# Patient Record
Sex: Female | Born: 1975 | Race: White | Hispanic: No | Marital: Married | Smoking: Former smoker
Health system: Southern US, Community
[De-identification: ages and names within clinical notes are randomized; demographics above are authoritative.]

## PROBLEM LIST (undated history)

## (undated) DIAGNOSIS — R51 Headache: Secondary | ICD-10-CM

## (undated) DIAGNOSIS — R519 Headache, unspecified: Secondary | ICD-10-CM

## (undated) DIAGNOSIS — D649 Anemia, unspecified: Secondary | ICD-10-CM

## (undated) DIAGNOSIS — C801 Malignant (primary) neoplasm, unspecified: Secondary | ICD-10-CM

## (undated) DIAGNOSIS — Z87442 Personal history of urinary calculi: Secondary | ICD-10-CM

## (undated) DIAGNOSIS — K219 Gastro-esophageal reflux disease without esophagitis: Secondary | ICD-10-CM

## (undated) DIAGNOSIS — I499 Cardiac arrhythmia, unspecified: Secondary | ICD-10-CM

## (undated) HISTORY — PX: TUBAL LIGATION: SHX77

## (undated) HISTORY — PX: ABDOMINAL HYSTERECTOMY: SHX81

## (undated) HISTORY — PX: OVARIAN CYST SURGERY: SHX726

---

## 2006-06-20 ENCOUNTER — Ambulatory Visit: Payer: Self-pay | Admitting: Ophthalmology

## 2009-07-18 ENCOUNTER — Emergency Department: Payer: Self-pay | Admitting: Emergency Medicine

## 2011-12-28 ENCOUNTER — Ambulatory Visit: Payer: Self-pay | Admitting: Obstetrics and Gynecology

## 2011-12-28 LAB — PREGNANCY, URINE: Pregnancy Test, Urine: NEGATIVE m[IU]/mL

## 2011-12-28 LAB — HEMOGLOBIN: HGB: 13.3 g/dL (ref 12.0–16.0)

## 2012-01-05 ENCOUNTER — Ambulatory Visit: Payer: Self-pay | Admitting: Obstetrics and Gynecology

## 2012-01-05 LAB — PREGNANCY, URINE: Pregnancy Test, Urine: NEGATIVE m[IU]/mL

## 2013-09-11 ENCOUNTER — Ambulatory Visit: Payer: Self-pay | Admitting: Internal Medicine

## 2013-10-20 ENCOUNTER — Emergency Department: Payer: Self-pay | Admitting: Emergency Medicine

## 2013-10-20 LAB — COMPREHENSIVE METABOLIC PANEL
Albumin: 3.8 g/dL (ref 3.4–5.0)
Anion Gap: 5 — ABNORMAL LOW (ref 7–16)
BUN: 9 mg/dL (ref 7–18)
Chloride: 109 mmol/L — ABNORMAL HIGH (ref 98–107)
Co2: 25 mmol/L (ref 21–32)
SGOT(AST): 19 U/L (ref 15–37)
SGPT (ALT): 17 U/L (ref 12–78)
Total Protein: 7.2 g/dL (ref 6.4–8.2)

## 2013-10-20 LAB — URINALYSIS, COMPLETE
Bacteria: NONE SEEN
Bilirubin,UR: NEGATIVE
Glucose,UR: NEGATIVE mg/dL (ref 0–75)
Nitrite: NEGATIVE
Ph: 5 (ref 4.5–8.0)
RBC,UR: 47 /HPF (ref 0–5)
Specific Gravity: 1.029 (ref 1.003–1.030)

## 2013-10-20 LAB — CBC
HGB: 13.2 g/dL (ref 12.0–16.0)
MCH: 29.4 pg (ref 26.0–34.0)
MCHC: 33.7 g/dL (ref 32.0–36.0)
MCV: 87 fL (ref 80–100)
RDW: 13.3 % (ref 11.5–14.5)

## 2013-10-20 LAB — LIPASE, BLOOD: Lipase: 115 U/L (ref 73–393)

## 2013-10-21 ENCOUNTER — Emergency Department: Payer: Self-pay | Admitting: Emergency Medicine

## 2013-10-21 LAB — BASIC METABOLIC PANEL
BUN: 15 mg/dL (ref 7–18)
Calcium, Total: 9.3 mg/dL (ref 8.5–10.1)
Chloride: 108 mmol/L — ABNORMAL HIGH (ref 98–107)
Co2: 26 mmol/L (ref 21–32)
Creatinine: 1.31 mg/dL — ABNORMAL HIGH (ref 0.60–1.30)
EGFR (African American): >60
EGFR (Non-African Amer.): 53 — ABNORMAL LOW
Glucose: 108 mg/dL — ABNORMAL HIGH (ref 65–99)
Osmolality: 277 (ref 275–301)
Potassium: 3.6 mmol/L (ref 3.5–5.1)
Sodium: 138 mmol/L (ref 136–145)

## 2013-10-21 LAB — CBC WITH DIFFERENTIAL/PLATELET
Basophil %: 0.5 %
Eosinophil #: 0 10*3/uL (ref 0.0–0.7)
Eosinophil %: 0.5 %
HCT: 34 % — ABNORMAL LOW (ref 35.0–47.0)
HGB: 11.6 g/dL — ABNORMAL LOW (ref 12.0–16.0)
Lymphocyte #: 1 10*3/uL (ref 1.0–3.6)
MCH: 29.8 pg (ref 26.0–34.0)
MCV: 88 fL (ref 80–100)
Monocyte #: 0.5 x10 3/mm (ref 0.2–0.9)
Monocyte %: 6.5 %
Neutrophil #: 6.7 10*3/uL — ABNORMAL HIGH (ref 1.4–6.5)
RBC: 3.88 10*6/uL (ref 3.80–5.20)
RDW: 13.5 % (ref 11.5–14.5)

## 2014-04-27 ENCOUNTER — Other Ambulatory Visit: Payer: Self-pay | Admitting: Physician Assistant

## 2014-04-27 LAB — D-DIMER(ARMC): D-DIMER: 209 ng/mL

## 2015-02-28 NOTE — Op Note (Signed)
PATIENT NAME:  Sara, Tucker MR#:  [id number] DATE OF BIRTH:  Jan 12, 1976  DATE OF PROCEDURE:  01/05/2012  PREOPERATIVE DIAGNOSES:  1. Elective permanent sterilization.  2. Elective removal of intrauterine device.   POSTOPERATIVE DIAGNOSES:  1. Elective permanent sterilization.  2. Elective removal of intrauterine device.   PROCEDURES PERFORMED:  1. Removal of IUD. 2. Laparoscopic bilateral tubal ligation, Falope rings.   SURGEON: Laverta Baltimore, MD  ANESTHESIA: General endotracheal.   INDICATION: This is a 39 year old gravida 2, para 2. The patient desires elective permanent sterilization. The patient has been using an intrauterine device contraception.   DESCRIPTION OF PROCEDURE: After adequate general endotracheal anesthesia, the patient was prepped and draped in normal sterile fashion. Speculum exam revealed IUD strings, which was removed with ring forceps; IUD intact. A single-tooth tenaculum was applied on the anterior cervix and a Kahn cannula placed in the endocervical canal to be used for uterine manipulation during the procedure. Gloves were changed. A 12 mm infraumbilical incision was made after injecting with 5% Marcaine. The laparoscope was advanced into the abdominal cavity under direct visualization with the Optiview cannula. The patient's abdomen was insufflated. A second port site was placed two fingerbreadths above the symphysis pubis. Again after injecting with Marcaine, the trocar was advanced into the abdominal cavity under direct visualization. Of note, the bladder was previously drained prior to commencement of the case yielding 50 mL of urine. The patient was placed in Trendelenburg. The ovaries and the fallopian tubes appeared normal. The right fallopian tube was grasped at the isthmic ampullary portion of the fallopian tube and the Falope ring was applied with resulting 1.5 cm knuckle of fallopian tube noted. A similar procedure was repeated on the patient's  left fallopian tube after visualizing the fimbriated end. Intraoperative picture was taken. The posterior cul-de-sac appeared normal. The appendix appeared normal. The upper abdomen appeared normal. The patient's abdomen was deflated. All instruments were removed. The infraumbilical incision was closed with two layers, deep 2-0 Vicryl layer and interrupted 4-0 Vicryl subcuticular layer. The suprapubic incision was closed with one interrupted 4-0 Vicryl suture. Sterile dressings were applied. The tenaculum was removed from the anterior cervix and silver nitrate sticks used for oozing at the tenacula site; no complications. The patient tolerated the procedure well. Estimated blood loss was minimal. Intraoperative fluids 800 mL. The patient was taken to the Recovery Room in good condition. ____________________________ Boykin Nearing, MD tjs:slb D: 01/05/2012 08:20:38 ET T: 01/05/2012 10:33:17 ET JOB#: [id number]  cc: Boykin Nearing, MD, <Dictator> Boykin Nearing MD ELECTRONICALLY SIGNED 01/06/2012 7:29

## 2016-01-24 DIAGNOSIS — R1110 Vomiting, unspecified: Secondary | ICD-10-CM | POA: Diagnosis not present

## 2016-01-24 DIAGNOSIS — Z3202 Encounter for pregnancy test, result negative: Secondary | ICD-10-CM | POA: Diagnosis not present

## 2016-01-24 DIAGNOSIS — R1032 Left lower quadrant pain: Secondary | ICD-10-CM | POA: Insufficient documentation

## 2016-01-24 LAB — COMPREHENSIVE METABOLIC PANEL
ALBUMIN: 3.6 g/dL (ref 3.5–5.0)
ALT: 27 U/L (ref 14–54)
AST: 29 U/L (ref 15–41)
Alkaline Phosphatase: 54 U/L (ref 38–126)
Anion gap: 5 (ref 5–15)
BILIRUBIN TOTAL: 0.6 mg/dL (ref 0.3–1.2)
BUN: 15 mg/dL (ref 6–20)
CALCIUM: 8.7 mg/dL — AB (ref 8.9–10.3)
CHLORIDE: 106 mmol/L (ref 101–111)
CO2: 25 mmol/L (ref 22–32)
CREATININE: 0.88 mg/dL (ref 0.44–1.00)
Glucose, Bld: 105 mg/dL — ABNORMAL HIGH (ref 65–99)
Potassium: 3.3 mmol/L — ABNORMAL LOW (ref 3.5–5.1)
Sodium: 136 mmol/L (ref 135–145)
TOTAL PROTEIN: 6.8 g/dL (ref 6.5–8.1)

## 2016-01-24 LAB — URINALYSIS COMPLETE WITH MICROSCOPIC (ARMC ONLY)
BILIRUBIN URINE: NEGATIVE
Bacteria, UA: NONE SEEN
GLUCOSE, UA: NEGATIVE mg/dL
Hgb urine dipstick: NEGATIVE
KETONES UR: NEGATIVE mg/dL
Leukocytes, UA: NEGATIVE
Nitrite: NEGATIVE
Protein, ur: 30 mg/dL — AB
Specific Gravity, Urine: 1.028 (ref 1.005–1.030)
pH: 6 (ref 5.0–8.0)

## 2016-01-24 LAB — CBC
HCT: 37.3 % (ref 35.0–47.0)
Hemoglobin: 12.9 g/dL (ref 12.0–16.0)
MCH: 29.9 pg (ref 26.0–34.0)
MCHC: 34.5 g/dL (ref 32.0–36.0)
MCV: 86.5 fL (ref 80.0–100.0)
PLATELETS: 139 10*3/uL — AB (ref 150–440)
RBC: 4.31 MIL/uL (ref 3.80–5.20)
RDW: 13.4 % (ref 11.5–14.5)
WBC: 4.6 10*3/uL (ref 3.6–11.0)

## 2016-01-24 LAB — LIPASE, BLOOD: LIPASE: 25 U/L (ref 11–51)

## 2016-01-24 LAB — POCT PREGNANCY, URINE: PREG TEST UR: NEGATIVE

## 2016-01-24 NOTE — ED Notes (Signed)
Pt in with co lower abd pain that started yest, has vomited x 2, no diarrhea.  Pt states radiates into mid chest no hx of reflux.

## 2016-01-25 ENCOUNTER — Emergency Department: Payer: Commercial Managed Care - HMO

## 2016-01-25 ENCOUNTER — Emergency Department
Admission: EM | Admit: 2016-01-25 | Discharge: 2016-01-25 | Disposition: A | Discharge status: Home / Self Care | Payer: Commercial Managed Care - HMO | Attending: Emergency Medicine | Admitting: Emergency Medicine

## 2016-01-25 DIAGNOSIS — R109 Unspecified abdominal pain: Secondary | ICD-10-CM

## 2016-01-25 DIAGNOSIS — R1032 Left lower quadrant pain: Secondary | ICD-10-CM

## 2016-01-25 MED ORDER — ONDANSETRON HCL 4 MG/2ML IJ SOLN
4.0000 mg | Freq: Once | INTRAMUSCULAR | Status: DC
  Filled 2016-01-25: qty 2

## 2016-01-25 MED ORDER — TRAMADOL HCL 50 MG PO TABS: 50.0000 mg | ORAL_TABLET | Freq: Four times a day (QID) | ORAL | Status: DC | PRN

## 2016-01-25 MED ORDER — MORPHINE SULFATE (PF) 4 MG/ML IV SOLN
4.0000 mg | Freq: Once | INTRAVENOUS | Status: DC
  Filled 2016-01-25: qty 1

## 2016-01-25 MED ORDER — SODIUM CHLORIDE 0.9 % IV BOLUS (SEPSIS): 1000.0000 mL | Freq: Once | INTRAVENOUS | Status: DC

## 2016-01-25 NOTE — ED Notes (Signed)
Pt reports lower abdominal pain starting yesterday. Pt report nausea and vomiting. Pt reports pain also in lower back. Pt in no acute distress at this time

## 2016-01-25 NOTE — Discharge Instructions (Signed)
Abdominal Pain, Adult It was a pleasure to take care of you today. As we discussed, without further evaluation and imaging, I cannot tell you exactly why you're having this pain. This causes me some concern, because it means that I cannot tell you what the natural course of the disease is or what to expect from it. Nor can I rule out some significant pathology as we discussed. For this reason I stressed that if you feel worse in any way including increased pain, lightheadedness, fever, persistent vomiting, any bleeding or any other new or worrisome symptoms that you return to the emergency room. Follow closely with your doctor. Also follow-up as discussed with her OB/GYN. Many things can cause abdominal pain. Usually, abdominal pain is not caused by a disease and will improve without treatment. It can often be observed and treated at home. Your health care provider will do a physical exam and possibly order blood tests and X-rays to help determine the seriousness of your pain. However, in many cases, more time must pass before a clear cause of the pain can be found. Before that point, your health care provider may not know if you need more testing or further treatment. HOME CARE INSTRUCTIONS Monitor your abdominal pain for any changes. The following actions may help to alleviate any discomfort you are experiencing:  Only take over-the-counter or prescription medicines as directed by your health care provider.  Do not take laxatives unless directed to do so by your health care provider.  Try a clear liquid diet (broth, tea, or water) as directed by your health care provider. Slowly move to a bland diet as tolerated. SEEK MEDICAL CARE IF:  You have unexplained abdominal pain.  You have abdominal pain associated with nausea or diarrhea.  You have pain when you urinate or have a bowel movement.  You experience abdominal pain that wakes you in the night.  You have abdominal pain that is worsened or  improved by eating food.  You have abdominal pain that is worsened with eating fatty foods.  You have a fever. SEEK IMMEDIATE MEDICAL CARE IF:  Your pain does not go away within 2 hours.  You keep throwing up (vomiting).  Your pain is felt only in portions of the abdomen, such as the right side or the left lower portion of the abdomen.  You pass bloody or black tarry stools. MAKE SURE YOU:  Understand these instructions.  Will watch your condition.  Will get help right away if you are not doing well or get worse.   This information is not intended to replace advice given to you by your health care provider. Make sure you discuss any questions you have with your health care provider.   Document Released: 08/02/2005 Document Revised: 07/14/2015 Document Reviewed: 07/02/2013 Elsevier Interactive Patient Education [employer].

## 2016-01-25 NOTE — ED Notes (Signed)
Pt refused CT stone study, IVAD, IVAD medications. MD made aware

## 2016-01-25 NOTE — ED Provider Notes (Addendum)
[facility] Emergency Department Provider Note  ____________________________________________   I have reviewed the triage vital signs and the nursing notes.   HISTORY  Chief Complaint Abdominal Pain    HPI Sara Tucker is a 40 y.o. female with a history of recurrent ovarian cysts as well as a history of a kidney stone presents today with left lower quadrant pain of several hours duration, associated with nausea and vomiting 2. No diarrhea. Feels somewhat like an ovarian cyst. However she wanted to have it checked out. Does not feel like a kidney stone. She states she has had no hematuria no dysuria no urinary frequency. She denies chest pain or shortness of breath. She denies fever or chills. She denies chest pain, she denies shortness of breath. The pain she states is localized to the lower abdominal region. He also adds however that she has a son at home who is brain injured, and that she does not wish to stay for any more testing. This is only after I did order some testing.She states she has chronic recurrent ovarian cysts, she also states that she has had her tubes tied and she has no thoughts of further pregnancies. Patient denies any vaginal bleeding or charge. She is not pregnant.  No past medical history on file.  There are no active problems to display for this patient.   No past surgical history on file.  No current outpatient prescriptions on file.  Allergies Review of patient's allergies indicates no known allergies.  No family history on file.  Social History Social History  Substance Use Topics  . Smoking status: Not on file  . Smokeless tobacco: Not on file  . Alcohol Use: Not on file    Review of Systems Constitutional: No fever/chills Eyes: No visual changes. ENT: No sore throat. No stiff neck no neck pain Cardiovascular: Denies chest pain. Respiratory: Denies shortness of breath. Gastrointestinal:   Positive vomiting.  No  diarrhea.  No constipation. Genitourinary: Negative for dysuria. Musculoskeletal: Negative lower extremity swelling Skin: Negative for rash. Neurological: Negative for headaches, focal weakness or numbness. 10-point ROS otherwise negative.  ____________________________________________   PHYSICAL EXAM:  VITAL SIGNS: ED Triage Vitals  Enc Vitals Group     BP 01/24/16 2200 150/119 mmHg     Pulse Rate 01/24/16 2200 98     Resp 01/24/16 2200 18     Temp 01/24/16 2200 98.2 F (36.8 C)     Temp Source 01/24/16 2200 Oral     SpO2 01/24/16 2200 98 %     Weight 01/24/16 2200 143 lb (64.864 kg)     Height 01/24/16 2200 5\' 4"  (1.626 m)     Head Cir --      Peak Flow --      Pain Score 01/24/16 2201 8     Pain Loc --      Pain Edu? --      Excl. in [place]? --     Constitutional: Alert and oriented. Well appearing and in no acute distress. Eyes: Conjunctivae are normal. PERRL. EOMI. Head: Atraumatic. Nose: No congestion/rhinnorhea. Mouth/Throat: Mucous membranes are moist.  Oropharynx non-erythematous. Neck: No stridor.   Nontender with no meningismus Cardiovascular: Normal rate, regular rhythm. Grossly normal heart sounds.  Good peripheral circulation. Respiratory: Normal respiratory effort.  No retractions. Lungs CTAB. Abdominal: Positive tender palpation of the left pelvic region. No distention. No guarding no rebound GU: Declines pelvic exam Back:  There is no focal tenderness or step off  there is no midline tenderness there are no lesions noted. there is left CVA tenderness Musculoskeletal: No lower extremity tenderness. No joint effusions, no DVT signs strong distal pulses no edema Neurologic:  Normal speech and language. No gross focal neurologic deficits are appreciated.  Skin:  Skin is warm, dry and intact. No rash noted. Psychiatric: Mood and affect are normal. Speech and behavior are normal.  ____________________________________________   LABS (all labs ordered are listed,  but only abnormal results are displayed)  Labs Reviewed  CBC - Abnormal; Notable for the following:    Platelets 139 (*)    All other components within normal limits  COMPREHENSIVE METABOLIC PANEL - Abnormal; Notable for the following:    Potassium 3.3 (*)    Glucose, Bld 105 (*)    Calcium 8.7 (*)    All other components within normal limits  URINALYSIS COMPLETEWITH MICROSCOPIC ([facility] ONLY) - Abnormal; Notable for the following:    Color, Urine YELLOW (*)    APPearance CLEAR (*)    Protein, ur 30 (*)    Squamous Epithelial / LPF 0-5 (*)    All other components within normal limits  LIPASE, BLOOD  POC URINE PREG, ED  POCT PREGNANCY, URINE   ____________________________________________  EKG  I personally interpreted any EKGs ordered by me or triage Normal sinus rhythm at 70 bpm no acute ST elevation or acute ST depression normal axis unremarkable EKG ____________________________________________  RADIOLOGY  I reviewed any imaging ordered by me or triage that were performed during my shift and, if possible, patient and/or family made aware of any abnormal findings. ____________________________________________   PROCEDURES  Procedure(s) performed: None  Critical Care performed: None  ____________________________________________   INITIAL IMPRESSION / ASSESSMENT AND PLAN / ED COURSE  Pertinent labs & imaging results that were available during my care of the patient were reviewed by me and considered in my medical decision making (see chart for details).  Patient with likely either ovarian cyst or kidney stone. She does have CVA tenderness which is minimal but that is atypical for a cyst. I was going to obtain a CT scan but patient refused. She also declined pelvic exam, pain medication, nausea medication, ultrasound, or other intervention. She states she is very reassured by the blood work we have done so far and she feels much better. She understands the risk of torsion,  diverticulitis, significant kidney stone or other intra-abdominal pathology including PID if she declines all the effort interventions. She states that she is "just fine with it". He is requesting a pain medication that is "not too strong" to take at home if she has increased pain. Since her return precautions given and she understands that because I cannot rule out these different entities, I cannot give her any prognosis for how her pain or symptoms will progress and I cannot rule out any significant pathology. This reason extensive return precautions and be given for any sign of hemorrhagic cyst, infectious process including increased pain vomiting or fever or any new or worrisome symptoms. ____________________________________________   FINAL CLINICAL IMPRESSION(S) / ED DIAGNOSES  Final diagnoses:  Left flank pain      This chart was dictated using voice recognition software.  Despite best efforts to proofread,  errors can occur which can change meaning.     Schuyler Amor, MD 01/25/16 [place], MD 01/25/16 [id number]

## 2016-01-26 ENCOUNTER — Ambulatory Visit
Admission: RE | Admit: 2016-01-26 | Discharge: 2016-01-26 | Disposition: A | Discharge status: Home / Self Care | Payer: Commercial Managed Care - HMO | Source: Ambulatory Visit | Attending: Internal Medicine | Admitting: Internal Medicine

## 2016-01-26 ENCOUNTER — Other Ambulatory Visit: Payer: Self-pay | Admitting: Internal Medicine

## 2016-01-26 DIAGNOSIS — R1084 Generalized abdominal pain: Secondary | ICD-10-CM | POA: Insufficient documentation

## 2016-07-14 ENCOUNTER — Other Ambulatory Visit
Admission: RE | Admit: 2016-07-14 | Discharge: 2016-07-14 | Disposition: A | Discharge status: Home / Self Care | Payer: Commercial Managed Care - HMO | Source: Ambulatory Visit | Attending: Physician Assistant | Admitting: Physician Assistant

## 2016-07-14 DIAGNOSIS — R600 Localized edema: Secondary | ICD-10-CM | POA: Insufficient documentation

## 2016-07-14 LAB — FIBRIN DERIVATIVES D-DIMER (ARMC ONLY): Fibrin derivatives D-dimer (ARMC): 327 (ref 0–499)

## 2016-07-27 ENCOUNTER — Other Ambulatory Visit: Payer: Self-pay | Admitting: Internal Medicine

## 2016-07-27 ENCOUNTER — Ambulatory Visit
Admission: RE | Admit: 2016-07-27 | Discharge: 2016-07-27 | Disposition: A | Discharge status: Home / Self Care | Payer: Commercial Managed Care - HMO | Source: Ambulatory Visit | Attending: Internal Medicine | Admitting: Internal Medicine

## 2016-07-27 DIAGNOSIS — R0602 Shortness of breath: Secondary | ICD-10-CM

## 2016-07-27 MED ORDER — IOPAMIDOL (ISOVUE-370) INJECTION 76%
75.0000 mL | Freq: Once | INTRAVENOUS | Status: AC | PRN
  Administered 2016-07-27: 75 mL via INTRAVENOUS

## 2016-08-08 ENCOUNTER — Telehealth: Payer: Self-pay | Admitting: Cardiovascular Disease

## 2016-08-08 NOTE — Telephone Encounter (Signed)
Received records from [facility] for appointment on 08/18/16 with Dr Gwenlyn Found.  Records given to [facility] (medical records) for Dr Kennon Holter schedule on 08/18/16. lp

## 2016-08-11 ENCOUNTER — Telehealth: Payer: Self-pay | Admitting: Cardiovascular Disease

## 2016-08-11 NOTE — Telephone Encounter (Signed)
Received records from [facility] for apt with Dr. Gwenlyn Found on 08/29/2016. Records given to [facility] (Medical Records) 08/11/2016 mwc

## 2016-08-18 ENCOUNTER — Ambulatory Visit: Payer: Commercial Managed Care - HMO | Admitting: Cardiovascular Disease

## 2016-08-25 ENCOUNTER — Other Ambulatory Visit: Payer: Self-pay | Admitting: Physician Assistant

## 2016-08-25 ENCOUNTER — Ambulatory Visit
Admission: RE | Admit: 2016-08-25 | Discharge: 2016-08-25 | Disposition: A | Discharge status: Home / Self Care | Payer: Commercial Managed Care - HMO | Source: Ambulatory Visit | Attending: Physician Assistant | Admitting: Physician Assistant

## 2016-08-25 DIAGNOSIS — M7989 Other specified soft tissue disorders: Secondary | ICD-10-CM

## 2016-08-29 ENCOUNTER — Encounter: Payer: Self-pay | Admitting: Cardiovascular Disease

## 2016-08-29 ENCOUNTER — Ambulatory Visit (INDEPENDENT_AMBULATORY_CARE_PROVIDER_SITE_OTHER): Payer: Commercial Managed Care - HMO | Admitting: Cardiovascular Disease

## 2016-08-29 ENCOUNTER — Other Ambulatory Visit: Payer: Self-pay | Admitting: *Deleted

## 2016-08-29 VITALS — BP 114/70 | HR 86 | Ht 64.0 in | Wt 168.0 lb

## 2016-08-29 DIAGNOSIS — Z1389 Encounter for screening for other disorder: Secondary | ICD-10-CM | POA: Diagnosis not present

## 2016-08-29 DIAGNOSIS — R002 Palpitations: Secondary | ICD-10-CM | POA: Diagnosis not present

## 2016-08-29 DIAGNOSIS — R600 Localized edema: Secondary | ICD-10-CM | POA: Insufficient documentation

## 2016-08-29 NOTE — Assessment & Plan Note (Signed)
Mrs. Sara Tucker was referred for palpitations and edema. She has had palpitations that occur several times a day over the last 2 months. She does admit to caffeine intake. Thyroid function tests were normal. Twelve-lead EKG is normal as well. I have advised  her to discontinue caffeine intake. We will check a 30 day event monitor.

## 2016-08-29 NOTE — Patient Instructions (Addendum)
Medication Instructions:  Your physician recommends that you continue on your current medications as directed. Please refer to the Current Medication list given to you today.   Labwork: Dr. Gwenlyn Found has ordered you to have a 24 hr urine collection to check for protein. Go down to the lab on the 1st floor and they will give you instructions.  Testing/Procedures: Your physician has recommended that you wear an event monitor. Event monitors are medical devices that record the heart's electrical activity. Doctors most often [place] these monitors to diagnose arrhythmias. Arrhythmias are problems with the speed or rhythm of the heartbeat. The monitor is a small, portable device. You can wear one while you do your normal daily activities. This is usually used to diagnose what is causing palpitations/syncope (passing out). 30 day event.   Follow-Up: Your physician recommends that you schedule a follow-up appointment in: 6 weeks from the time the monitor is placed at [facility] street with Dr. Gwenlyn Found.   Any Other Special Instructions Will Be Listed Below (If Applicable).  STOP Caffeine for palpitations.   If you need a refill on your cardiac medications before your next appointment, please call your pharmacy.

## 2016-08-29 NOTE — Progress Notes (Signed)
08/29/2016 [place]   28-Feb-1976  [id number]  Primary Physician SPARKS,JEFFREY D, MD Primary Cardiologist: Lorretta Harp MD Renae Gloss  HPI:  [name] is a very pleasant 40 year old mild to mildly overweight single Caucasian female mother of 3 children who works as an Medical illustrator. She was referred by Dr. Doy Hutching at [facility] clinic for cardiovascular evaluation because of palpitations and lower extremity edema. She has no prior cardiac history. Risk factors include 8-10 pack years of tobacco abuse currently smoking one third of a pack per day. She does have mild hyperlipidemia as well. Her father, Sara Tucker, is also a patient of ours has CAD and PAD. She has never had a heart attack or stroke. She does have dyspnea on exertion and orthopnea. She's noticed palpitations over the last several months occurring on a daily basis as well as bilateral lower extremity edema left greater than right. Workup has included 2-D echo performed 07/21/16 which was normal. She had chest CT as well the results of which are unavailable at current time. Venous Doppler studies showed no evidence of DVT or reflux. She has tried diuretics without success as well as compression stockings. She does not add salt to her food.   Current Outpatient Prescriptions  Medication Sig Dispense Refill  . citalopram (CELEXA) 40 MG tablet Take 40 mg by mouth daily.     No current facility-administered medications for this visit.     Allergies  Allergen Reactions  . Sulfa Antibiotics Other (See Comments)    Social History   Social History  . Marital status: Divorced    Spouse name: N/A  . Number of children: N/A  . Years of education: N/A   Occupational History  . Not on file.   Social History Main Topics  . Smoking status: Current Every Day Smoker  . Smokeless tobacco: Never Used  . Alcohol use Not on file  . Drug use: Unknown  . Sexual activity: Not on file   Other Topics  Concern  . Not on file   Social History Narrative  . No narrative on file     Review of Systems: General: negative for chills, fever, night sweats or weight changes.  Cardiovascular: negative for chest pain, dyspnea on exertion, edema, orthopnea, palpitations, paroxysmal nocturnal dyspnea or shortness of breath Dermatological: negative for rash Respiratory: negative for cough or wheezing Urologic: negative for hematuria Abdominal: negative for nausea, vomiting, diarrhea, bright red blood per rectum, melena, or hematemesis Neurologic: negative for visual changes, syncope, or dizziness All other systems reviewed and are otherwise negative except as noted above.    Blood pressure 114/70, pulse 86, height 5\' 4"  (1.626 m), weight 168 lb (76.2 kg).  General appearance: alert and no distress Neck: no adenopathy, no carotid bruit, no JVD, supple, symmetrical, trachea midline and thyroid not enlarged, symmetric, no tenderness/mass/nodules Lungs: clear to auscultation bilaterally Heart: regular rate and rhythm, S1, S2 normal, no murmur, click, rub or gallop Extremities: Trace edema bilaterally, 2+ pedal pulses, tenderness to palpation.  EKG sinus rhythm at 86 without ST or T-wave changes. I personally reviewed this EKG  ASSESSMENT AND PLAN:   Palpitations Mrs. Sara Tucker was referred for palpitations and edema. She has had palpitations that occur several times a day over the last 2 months. She does admit to caffeine intake. Thyroid function tests were normal. Twelve-lead EKG is normal as well. I have advised  her to discontinue caffeine intake. We will check a 30 day  event monitor.  Bilateral lower extremity edema Miss Sara Tucker has extensive bilateral lower extremity edema left greater than right over the last 2 months. A 2-D echo performed on 07/21/16 was normal. She also complains of some orthopnea. She has tried compression stockings which were not helpful. She's also been on diuretics which  were not helpful as well. The edema is worse in the evening hours. Venous Dopplers that were recently performed showed no evidence of DVT or reflux. I'm going to get a 24-hour urine for total protein.      Lorretta Harp MD FACP,FACC,FAHA, [facility] 08/29/2016 9:42 AM

## 2016-08-29 NOTE — Assessment & Plan Note (Signed)
Miss Sara Tucker has extensive bilateral lower extremity edema left greater than right over the last 2 months. A 2-D echo performed on 07/21/16 was normal. She also complains of some orthopnea. She has tried compression stockings which were not helpful. She's also been on diuretics which were not helpful as well. The edema is worse in the evening hours. Venous Dopplers that were recently performed showed no evidence of DVT or reflux. I'm going to get a 24-hour urine for total protein.

## 2016-09-02 LAB — PROTEIN, URINE, 24 HOUR
PROTEIN 24H UR: 126 mg/(24.h) (ref <150)
Protein, Urine: 7 mg/dL (ref 5–24)

## 2016-09-06 ENCOUNTER — Ambulatory Visit (INDEPENDENT_AMBULATORY_CARE_PROVIDER_SITE_OTHER): Payer: Commercial Managed Care - HMO

## 2016-09-06 DIAGNOSIS — R002 Palpitations: Secondary | ICD-10-CM

## 2016-09-12 ENCOUNTER — Telehealth: Payer: Self-pay | Admitting: Cardiovascular Disease

## 2016-09-12 NOTE — Telephone Encounter (Signed)
New Message ° °Pt voiced calling to get results. ° °Please f/u °

## 2016-09-12 NOTE — Telephone Encounter (Signed)
Results discussed, see note for protein 24 hr collection.

## 2016-10-17 ENCOUNTER — Encounter: Payer: Self-pay | Admitting: Cardiovascular Disease

## 2016-10-17 ENCOUNTER — Ambulatory Visit (INDEPENDENT_AMBULATORY_CARE_PROVIDER_SITE_OTHER): Payer: Commercial Managed Care - HMO | Admitting: Cardiovascular Disease

## 2016-10-17 DIAGNOSIS — R600 Localized edema: Secondary | ICD-10-CM | POA: Diagnosis not present

## 2016-10-17 DIAGNOSIS — R002 Palpitations: Secondary | ICD-10-CM | POA: Diagnosis not present

## 2016-10-17 NOTE — Patient Instructions (Signed)
Medication Instructions: Your physician recommends that you continue on your current medications as directed. Please refer to the Current Medication list given to you today.  Follow-Up: Your physician recommends that you follow-up with Dr. Gwenlyn Found as needed.  If you need a refill on your cardiac medications before your next appointment, please call your pharmacy.

## 2016-10-17 NOTE — Assessment & Plan Note (Signed)
Lower extremity venous Dopplers are negative. 24 hour urine for total protein was normal. She says that her swelling has somewhat improved.

## 2016-10-17 NOTE — Assessment & Plan Note (Signed)
Sara Tucker returns today for follow-up of palpitations. Her 30 day event monitor was completely normal. Her symptoms have markedly improved.

## 2016-10-17 NOTE — Progress Notes (Signed)
Sara Tucker returns here for follow-up of her outpatient studies and evaluation of palpitations and lower extremity edema. She had a normal 2-D echo. The monitor was normal as well without evidence of PACs, PVCs or any tachyarrhythmia. Her venous Dopplers were negative as well for DVT. Her urine was normal. I have reassured her that we have not demonstrated any abnormalities on monitoring to be concerned about and she does feel clinically improved for unclear reasons. I will see her back when necessary.

## 2016-11-01 ENCOUNTER — Telehealth: Payer: Self-pay | Admitting: *Deleted

## 2016-11-01 ENCOUNTER — Encounter: Payer: Self-pay | Admitting: *Deleted

## 2016-11-01 NOTE — Progress Notes (Signed)
Patient ID: Sara Tucker, female   DOB: [birth year], 40 y.o.   MRN: [id number] Received [employer] 30 day cardiac event monitor, delivered to [facility] office, with message return to sender, receiver refused delivery.    There was prepaid postage on [employer] packaging to deliver back to [employer] after use.  [employer] representative Evelena Asa called and given patient name to make sure patient was not billed for not returning equipment.  He will pick up the monitor next time he is in the office.

## 2016-11-01 NOTE — Telephone Encounter (Signed)
Patient called to inform about [employer] package returned to our office, (see 11/01/17 documentation).  Patient did not answer.

## 2017-03-23 ENCOUNTER — Ambulatory Visit
Admission: RE | Admit: 2017-03-23 | Discharge: 2017-03-23 | Disposition: A | Discharge status: Home / Self Care | Payer: Commercial Managed Care - HMO | Source: Ambulatory Visit | Attending: Physician Assistant | Admitting: Physician Assistant

## 2017-03-23 ENCOUNTER — Other Ambulatory Visit: Payer: Self-pay | Admitting: Physician Assistant

## 2017-03-23 DIAGNOSIS — M25512 Pain in left shoulder: Secondary | ICD-10-CM | POA: Insufficient documentation

## 2017-03-23 DIAGNOSIS — R221 Localized swelling, mass and lump, neck: Secondary | ICD-10-CM | POA: Diagnosis not present

## 2017-11-22 ENCOUNTER — Other Ambulatory Visit: Payer: Self-pay

## 2017-11-22 ENCOUNTER — Encounter
Admission: RE | Admit: 2017-11-22 | Discharge: 2017-11-22 | Disposition: A | Discharge status: Home / Self Care | Payer: 59 | Source: Ambulatory Visit | Attending: Obstetrics and Gynecology | Admitting: Obstetrics and Gynecology

## 2017-11-22 HISTORY — DX: Headache, unspecified: R51.9

## 2017-11-22 HISTORY — DX: Gastro-esophageal reflux disease without esophagitis: K21.9

## 2017-11-22 HISTORY — DX: Headache: R51

## 2017-11-22 HISTORY — DX: Cardiac arrhythmia, unspecified: I49.9

## 2017-11-22 HISTORY — DX: Personal history of urinary calculi: Z87.442

## 2017-11-22 HISTORY — DX: Anemia, unspecified: D64.9

## 2017-11-22 NOTE — H&P (Signed)
Patient ID: Sara Tucker is a 42 y.o. female presenting with Pre Op Consulting  on 11/21/2017  HPI:  AUB - menorrhagia TVUS wnl - no free fluid, no fibroids, normal adnexa  Last pap smear  09/2017: neg with positive HPV 16 11/01/17: Colposcopy: Diagnosis:  Part A: CERVIX, 12:00, BIOPSY:  BENIGN ENDOCERVICAL TISSUE. CHRONIC CERVICITIS.  Part B: CERVIX, 6:00, BIOPSY:  LOW-GRADE SQUAMOUS INTRAEPITHELIAL LESION (CIN 1).  Part C: CERVIX, 8:00, BIOPSY:  BENIGN ENDOCERVICAL TISSUE. CHRONIC CERVICITIS.  Part D: ENDOCERVIX, CURETTINGS:  MINUTE FRAGMENTS OF BENIGN ENDOCERVICAL GLANDS, MUCUS, AND BLOOD.  BENIGN ENDOMETRIAL POLYP.  NAP/11/05/2017  Hx of BTL, interested in hysterectomy. NSVD x2 Benign intracranial HTN   Past Medical History:  has a past medical history of Anxiety, unspecified, Depression, unspecified, Environmental allergies, Hypercholesterolemia, Internal hemorrhoids, Other and unspecified hyperlipidemia, and Ovarian cyst.  Past Surgical History:  has a past surgical history that includes Status post IUD placement and Tubal ligation. Family History: family history includes Cancer in her maternal grandmother; Diabetes in her brother, maternal grandfather, and mother; Diabetes type II in her father and mother; Heart disease in her father and mother; Neuropathy in her mother. Social History:  reports that she quit smoking about 6 months ago. She has never used smokeless tobacco. She reports that she does not drink alcohol or use drugs. OB/GYN History:          OB History    Gravida  2   Para  2   Term  2   Preterm      AB      Living  2     SAB      TAB      Ectopic      Molar      Multiple      Live Births             Allergies: is allergic to sulfa (sulfonamide antibiotics) and sulfasalazine. Medications:  Current Outpatient Medications:  .  ciprofloxacin HCl (CIPRO) 500 MG tablet, Take 1 tablet (500 mg total) by mouth 2 (two)  times daily, Disp: 14 tablet, Rfl: 0 .  desvenlafaxine succinate (PRISTIQ) 100 MG ER tablet, Take 1 tablet (100 mg total) by mouth once daily. (Patient not taking: Reported on 11/21/2017 ), Disp: 90 tablet, Rfl: 3 .  nystatin (MYCOSTATIN) 100,000 unit/gram cream, Apply topically 2 (two) times daily (Patient not taking: Reported on 11/21/2017 ), Disp: 30 g, Rfl: 1 .  tranexamic acid (LYSTEDA) 650 mg tablet, Take 2 tablets (1,300 mg total) by mouth 3 (three) times daily Take for a maximum of 5 days during monthly menstruation. (Patient not taking: Reported on 11/21/2017 ), Disp: 30 tablet, Rfl: 3   Review of Systems: No SOB, no palpitations or chest pain, no new lower extremity edema, no nausea or vomiting or bowel or bladder complaints. See HPI for gyn specific ROS.   Exam:   BP (!) 135/95   Pulse (!) 127   Wt 75.3 kg (166 lb)   BMI 28.49 kg/m   General: Patient is well-groomed, well-nourished, appears stated age in no acute distress  HEENT: head is atraumatic and normocephalic, trachea is midline, neck is supple with no palpable nodules  CV: Regular rhythm and normal heart rate, no murmur  Pulm: Clear to auscultation throughout lung fields with no wheezing, crackles, or rhonchi. No increased work of breathing  Abdomen: soft , no mass, non-tender, no rebound tenderness, no hepatomegaly  Pelvic: deferred  Impression:   The  primary encounter diagnosis was Excessive or frequent menstruation. A diagnosis of CIN I (cervical intraepithelial neoplasia I) was also pertinent to this visit.    Plan:    Patient returns for a preoperative discussion regarding her plans to proceed with surgical treatment of her AUB by total laparoscopic hysterectomy with bilateral salpingectomy with cystoscopy procedure.   The patient and I discussed the technical aspects of the procedure including the potential for risks and complications. These include but are not limited to the risk of  infection requiring post-operative antibiotics or further procedures. We talked about the risk of injury to adjacent organs including bladder, bowel, ureter, blood vessels or nerves. We talked about the need to convert to an open incision. We talked about the possible need for blood transfusion. We talked aboutpostop complications such asthromboembolic or cardiopulmonary complications. All of her questions were answered.  Her preoperative exam was completed and the appropriate consents were signed. She is scheduled to undergo this procedure in the near future.  Specific Peri-operative Considerations:  - Consent: obtained today - Health Maintenance: up todate - Labs: CBC, CMP preoperatively - Studies: EKG, CXR preoperatively - Bowel Preparation: None required - Abx:  Cefoxitin 2g - VTE ppx: SCDs perioperatively - Glucose Protocol: n/a - Beta-blockade: n/a

## 2017-11-22 NOTE — Patient Instructions (Signed)
  Your procedure is scheduled on: 12-07-17 FRIDAY Report to Same Day Surgery 2nd floor medical mall [facility] Entrance-take elevator on left to 2nd floor.  Check in with surgery information desk.) To find out your arrival time please call [phone] between 1PM - 3PM on 12-06-17 THURSDAY  Remember: Instructions that are not followed completely may result in serious medical risk, up to and including death, or upon the discretion of your surgeon and anesthesiologist your surgery may need to be rescheduled.    _x___ 1. Do not eat food after midnight the night before your procedure. NO GUM OR CANDY AFTER MIDNIGHT.  You may drink clear liquids up to 2 hours before you are scheduled to arrive at the hospital for your procedure.  Do not drink clear liquids within 2 hours of your scheduled arrival to the hospital.  Clear liquids include  --Water or Apple juice without pulp  --Clear carbohydrate beverage such as ClearFast or Gatorade  --Black Coffee or Clear Tea (No milk, no creamers, do not add anything to the coffee or Tea     __x__ 2. No Alcohol for 24 hours before or after surgery.   __x__3. No Smoking for 24 prior to surgery.   ____  4. Bring all medications with you on the day of surgery if instructed.    __x__ 5. Notify your doctor if there is any change in your medical condition     (cold, fever, infections).     Do not wear jewelry, make-up, hairpins, clips or nail polish.  Do not wear lotions, powders, or perfumes. You may wear deodorant.  Do not shave 48 hours prior to surgery. Men may shave face and neck.  Do not bring valuables to the hospital.    [facility] is not responsible for any belongings or valuables.               Contacts, dentures or bridgework may not be worn into surgery.  Leave your suitcase in the car. After surgery it may be brought to your room.  For patients admitted to the hospital, discharge time is determined by your treatment team.   Patients  discharged the day of surgery will not be allowed to drive home.  You will need someone to drive you home and stay with you the night of your procedure.    Please read over the following fact sheets that you were given:   [facility] Preparing for Surgery and or MRSA Information   _x___ TAKE THE FOLLOWING MEDICATION THE MORNING OF SURGERY WITH A SMALL SIP OF WATER. These include:  1. ZANTAC  2. TAKE A ZANTAC Thursday NIGHT BEFORE BED (12-06-17)  3.  4.  5.  6.  ____Fleets enema or Magnesium Citrate as directed.   _x___ Use CHG Soap or sage wipes as directed on instruction sheet   ____ Use inhalers on the day of surgery and bring to hospital day of surgery  ____ Stop Metformin and Janumet 2 days prior to surgery.    ____ Take 1/2 of usual insulin dose the night before surgery and none on the morning surgery.   ____ Follow recommendations from Cardiologist, Pulmonologist or PCP regarding stopping Aspirin, Coumadin, Plavix ,Eliquis, Effient, or Pradaxa, and Pletal.  X____Stop Anti-inflammatories such as Advil, Aleve, Ibuprofen, Motrin, Naproxen, Naprosyn, Goodies powders or aspirin products NOW-OK to take Tylenol    ____ Stop supplements until after surgery.     ____ Bring C-Pap to the hospital.

## 2017-11-26 ENCOUNTER — Encounter
Admission: RE | Admit: 2017-11-26 | Discharge: 2017-11-26 | Disposition: A | Discharge status: Home / Self Care | Payer: 59 | Source: Ambulatory Visit | Attending: Obstetrics and Gynecology | Admitting: Obstetrics and Gynecology

## 2017-11-26 DIAGNOSIS — N920 Excessive and frequent menstruation with regular cycle: Secondary | ICD-10-CM | POA: Diagnosis not present

## 2017-11-26 DIAGNOSIS — Z01812 Encounter for preprocedural laboratory examination: Secondary | ICD-10-CM | POA: Insufficient documentation

## 2017-11-26 DIAGNOSIS — Z833 Family history of diabetes mellitus: Secondary | ICD-10-CM | POA: Diagnosis not present

## 2017-11-26 DIAGNOSIS — N870 Mild cervical dysplasia: Secondary | ICD-10-CM | POA: Insufficient documentation

## 2017-11-26 DIAGNOSIS — Z9851 Tubal ligation status: Secondary | ICD-10-CM | POA: Diagnosis not present

## 2017-11-26 DIAGNOSIS — Z87891 Personal history of nicotine dependence: Secondary | ICD-10-CM | POA: Insufficient documentation

## 2017-11-26 DIAGNOSIS — Z8249 Family history of ischemic heart disease and other diseases of the circulatory system: Secondary | ICD-10-CM | POA: Diagnosis not present

## 2017-11-26 DIAGNOSIS — Z882 Allergy status to sulfonamides status: Secondary | ICD-10-CM | POA: Insufficient documentation

## 2017-11-26 LAB — CBC
HCT: 40.1 % (ref 35.0–47.0)
HEMOGLOBIN: 13.3 g/dL (ref 12.0–16.0)
MCH: 28.2 pg (ref 26.0–34.0)
MCHC: 33.2 g/dL (ref 32.0–36.0)
MCV: 84.9 fL (ref 80.0–100.0)
Platelets: 194 10*3/uL (ref 150–440)
RBC: 4.72 MIL/uL (ref 3.80–5.20)
RDW: 14.4 % (ref 11.5–14.5)
WBC: 5.3 10*3/uL (ref 3.6–11.0)

## 2017-11-26 LAB — BASIC METABOLIC PANEL
ANION GAP: 9 (ref 5–15)
BUN: 10 mg/dL (ref 6–20)
CALCIUM: 9.1 mg/dL (ref 8.9–10.3)
CO2: 24 mmol/L (ref 22–32)
Chloride: 104 mmol/L (ref 101–111)
Creatinine, Ser: 0.77 mg/dL (ref 0.44–1.00)
Glucose, Bld: 81 mg/dL (ref 65–99)
Potassium: 3.6 mmol/L (ref 3.5–5.1)
Sodium: 137 mmol/L (ref 135–145)

## 2017-12-03 ENCOUNTER — Encounter
Admission: RE | Admit: 2017-12-03 | Discharge: 2017-12-03 | Disposition: A | Discharge status: Home / Self Care | Payer: 59 | Source: Ambulatory Visit | Attending: Obstetrics and Gynecology | Admitting: Obstetrics and Gynecology

## 2017-12-03 DIAGNOSIS — Z01812 Encounter for preprocedural laboratory examination: Secondary | ICD-10-CM | POA: Diagnosis not present

## 2017-12-03 DIAGNOSIS — R51 Headache: Secondary | ICD-10-CM | POA: Diagnosis not present

## 2017-12-03 DIAGNOSIS — R600 Localized edema: Secondary | ICD-10-CM | POA: Diagnosis not present

## 2017-12-03 DIAGNOSIS — R002 Palpitations: Secondary | ICD-10-CM | POA: Diagnosis not present

## 2017-12-03 DIAGNOSIS — K219 Gastro-esophageal reflux disease without esophagitis: Secondary | ICD-10-CM | POA: Insufficient documentation

## 2017-12-03 NOTE — Pre-Procedure Instructions (Signed)
BLOOD HEMOLYZED FOR T&S ON 11-26-17 SO PT CAME IN FOR ANOTHER REDRAW TODAY FOR T&S AND IT HEMOLYZED AGAIN TODAY.  LEONARD DREW FROM DIFFERENT ARMS EACH TIME AND IT STILL HEMOLYZED.  WILL REDRAW T&S ON DAY OF SURGERY

## 2017-12-06 MED ORDER — CEFAZOLIN SODIUM-DEXTROSE 2-4 GM/100ML-% IV SOLN
2.0000 g | INTRAVENOUS | Status: AC
  Administered 2017-12-07: 2 g via INTRAVENOUS
  Filled 2017-12-06: qty 100

## 2017-12-07 ENCOUNTER — Ambulatory Visit
Admission: RE | Admit: 2017-12-07 | Discharge: 2017-12-07 | Disposition: A | Discharge status: Home / Self Care | Payer: Commercial Managed Care - HMO | Source: Ambulatory Visit | Attending: Obstetrics and Gynecology | Admitting: Obstetrics and Gynecology

## 2017-12-07 ENCOUNTER — Encounter
Admission: RE | Disposition: A | Discharge status: Home / Self Care | Payer: Self-pay | Source: Ambulatory Visit | Attending: Obstetrics and Gynecology

## 2017-12-07 ENCOUNTER — Encounter: Payer: Self-pay | Admitting: *Deleted

## 2017-12-07 ENCOUNTER — Ambulatory Visit: Payer: Commercial Managed Care - HMO | Admitting: Certified Registered Nurse Anesthetist

## 2017-12-07 ENCOUNTER — Other Ambulatory Visit: Payer: Self-pay

## 2017-12-07 DIAGNOSIS — E7800 Pure hypercholesterolemia, unspecified: Secondary | ICD-10-CM | POA: Diagnosis not present

## 2017-12-07 DIAGNOSIS — N72 Inflammatory disease of cervix uteri: Secondary | ICD-10-CM | POA: Diagnosis not present

## 2017-12-07 DIAGNOSIS — Z87891 Personal history of nicotine dependence: Secondary | ICD-10-CM | POA: Diagnosis not present

## 2017-12-07 DIAGNOSIS — G932 Benign intracranial hypertension: Secondary | ICD-10-CM | POA: Insufficient documentation

## 2017-12-07 DIAGNOSIS — F329 Major depressive disorder, single episode, unspecified: Secondary | ICD-10-CM | POA: Diagnosis not present

## 2017-12-07 DIAGNOSIS — F419 Anxiety disorder, unspecified: Secondary | ICD-10-CM | POA: Diagnosis not present

## 2017-12-07 DIAGNOSIS — N920 Excessive and frequent menstruation with regular cycle: Secondary | ICD-10-CM | POA: Diagnosis not present

## 2017-12-07 DIAGNOSIS — N939 Abnormal uterine and vaginal bleeding, unspecified: Secondary | ICD-10-CM | POA: Insufficient documentation

## 2017-12-07 DIAGNOSIS — Z79899 Other long term (current) drug therapy: Secondary | ICD-10-CM | POA: Insufficient documentation

## 2017-12-07 HISTORY — PX: LAPAROSCOPIC BILATERAL SALPINGECTOMY: SHX5889

## 2017-12-07 HISTORY — PX: LAPAROSCOPIC HYSTERECTOMY: SHX1926

## 2017-12-07 HISTORY — PX: CYSTOSCOPY: SHX5120

## 2017-12-07 LAB — POCT PREGNANCY, URINE: Preg Test, Ur: NEGATIVE

## 2017-12-07 LAB — TYPE AND SCREEN
ABO/RH(D): A POS
Antibody Screen: NEGATIVE

## 2017-12-07 SURGERY — HYSTERECTOMY, TOTAL, LAPAROSCOPIC
Anesthesia: General

## 2017-12-07 MED ORDER — ACETAMINOPHEN 500 MG PO TABS: 1000.0000 mg | ORAL_TABLET | Freq: Four times a day (QID) | ORAL | 0 refills | Status: AC

## 2017-12-07 MED ORDER — PROPOFOL 10 MG/ML IV BOLUS
INTRAVENOUS | Status: AC
  Filled 2017-12-07: qty 20

## 2017-12-07 MED ORDER — OXYCODONE HCL 5 MG PO CAPS: 5.0000 mg | ORAL_CAPSULE | Freq: Four times a day (QID) | ORAL | 0 refills | Status: AC | PRN

## 2017-12-07 MED ORDER — SUGAMMADEX SODIUM 200 MG/2ML IV SOLN
INTRAVENOUS | Status: DC | PRN
  Administered 2017-12-07: 150 mg via INTRAVENOUS

## 2017-12-07 MED ORDER — PROMETHAZINE HCL 25 MG/ML IJ SOLN: 6.2500 mg | INTRAMUSCULAR | Status: DC | PRN

## 2017-12-07 MED ORDER — DEXAMETHASONE SODIUM PHOSPHATE 10 MG/ML IJ SOLN
INTRAMUSCULAR | Status: DC | PRN
  Administered 2017-12-07: 8 mg via INTRAVENOUS

## 2017-12-07 MED ORDER — FENTANYL CITRATE (PF) 100 MCG/2ML IJ SOLN
INTRAMUSCULAR | Status: AC
  Administered 2017-12-07: 50 ug via INTRAVENOUS
  Filled 2017-12-07: qty 2

## 2017-12-07 MED ORDER — LACTATED RINGERS IV SOLN: INTRAVENOUS | Status: DC

## 2017-12-07 MED ORDER — FENTANYL CITRATE (PF) 100 MCG/2ML IJ SOLN
INTRAMUSCULAR | Status: DC | PRN
  Administered 2017-12-07 (×3): 50 ug via INTRAVENOUS
  Administered 2017-12-07: 100 ug via INTRAVENOUS

## 2017-12-07 MED ORDER — BUPIVACAINE HCL 0.5 % IJ SOLN: INTRAMUSCULAR | Status: DC | PRN

## 2017-12-07 MED ORDER — KETOROLAC TROMETHAMINE 30 MG/ML IJ SOLN
INTRAMUSCULAR | Status: AC
  Filled 2017-12-07: qty 1

## 2017-12-07 MED ORDER — ROCURONIUM BROMIDE 50 MG/5ML IV SOLN
INTRAVENOUS | Status: AC
  Filled 2017-12-07: qty 1

## 2017-12-07 MED ORDER — FENTANYL CITRATE (PF) 100 MCG/2ML IJ SOLN
25.0000 ug | INTRAMUSCULAR | Status: DC | PRN
  Administered 2017-12-07 (×2): 50 ug via INTRAVENOUS
  Administered 2017-12-07 (×2): 25 ug via INTRAVENOUS

## 2017-12-07 MED ORDER — LACTATED RINGERS IV SOLN
INTRAVENOUS | Status: DC
  Administered 2017-12-07 (×2): via INTRAVENOUS

## 2017-12-07 MED ORDER — CEFAZOLIN SODIUM-DEXTROSE 2-4 GM/100ML-% IV SOLN
INTRAVENOUS | Status: AC
  Filled 2017-12-07: qty 100

## 2017-12-07 MED ORDER — ROCURONIUM BROMIDE 100 MG/10ML IV SOLN
INTRAVENOUS | Status: DC | PRN
  Administered 2017-12-07: 10 mg via INTRAVENOUS
  Administered 2017-12-07: 50 mg via INTRAVENOUS

## 2017-12-07 MED ORDER — ONDANSETRON HCL 4 MG/2ML IJ SOLN
INTRAMUSCULAR | Status: AC
  Filled 2017-12-07: qty 2

## 2017-12-07 MED ORDER — LIDOCAINE HCL (PF) 2 % IJ SOLN
INTRAMUSCULAR | Status: AC
  Filled 2017-12-07: qty 10

## 2017-12-07 MED ORDER — SUGAMMADEX SODIUM 200 MG/2ML IV SOLN
INTRAVENOUS | Status: AC
  Filled 2017-12-07: qty 2

## 2017-12-07 MED ORDER — KETOROLAC TROMETHAMINE 30 MG/ML IJ SOLN
INTRAMUSCULAR | Status: DC | PRN
  Administered 2017-12-07: 30 mg via INTRAVENOUS

## 2017-12-07 MED ORDER — ONDANSETRON HCL 4 MG/2ML IJ SOLN
INTRAMUSCULAR | Status: DC | PRN
  Administered 2017-12-07: 4 mg via INTRAVENOUS

## 2017-12-07 MED ORDER — DEXAMETHASONE SODIUM PHOSPHATE 10 MG/ML IJ SOLN
INTRAMUSCULAR | Status: AC
  Filled 2017-12-07: qty 1

## 2017-12-07 MED ORDER — MIDAZOLAM HCL 2 MG/2ML IJ SOLN
INTRAMUSCULAR | Status: DC | PRN
  Administered 2017-12-07: 2 mg via INTRAVENOUS

## 2017-12-07 MED ORDER — DOCUSATE SODIUM 100 MG PO CAPS: 100.0000 mg | ORAL_CAPSULE | Freq: Two times a day (BID) | ORAL | 0 refills | Status: AC

## 2017-12-07 MED ORDER — ACETAMINOPHEN 10 MG/ML IV SOLN
INTRAVENOUS | Status: DC | PRN
  Administered 2017-12-07: 1000 mg via INTRAVENOUS

## 2017-12-07 MED ORDER — OXYCODONE HCL 5 MG PO TABS
5.0000 mg | ORAL_TABLET | Freq: Once | ORAL | Status: AC
  Administered 2017-12-07: 5 mg via ORAL

## 2017-12-07 MED ORDER — FENTANYL CITRATE (PF) 250 MCG/5ML IJ SOLN
INTRAMUSCULAR | Status: AC
  Filled 2017-12-07: qty 5

## 2017-12-07 MED ORDER — BUPIVACAINE HCL (PF) 0.5 % IJ SOLN
INTRAMUSCULAR | Status: AC
  Filled 2017-12-07: qty 30

## 2017-12-07 MED ORDER — LIDOCAINE HCL (CARDIAC) 20 MG/ML IV SOLN
INTRAVENOUS | Status: DC | PRN
  Administered 2017-12-07: 80 mg via INTRAVENOUS

## 2017-12-07 MED ORDER — GLYCOPYRROLATE 0.2 MG/ML IJ SOLN
INTRAMUSCULAR | Status: DC | PRN
  Administered 2017-12-07: 0.2 mg via INTRAVENOUS

## 2017-12-07 MED ORDER — IBUPROFEN 800 MG PO TABS: 800.0000 mg | ORAL_TABLET | Freq: Three times a day (TID) | ORAL | 1 refills | Status: AC | PRN

## 2017-12-07 MED ORDER — PHENYLEPHRINE HCL 10 MG/ML IJ SOLN
INTRAMUSCULAR | Status: DC | PRN
  Administered 2017-12-07: 200 ug via INTRAVENOUS
  Administered 2017-12-07: 100 ug via INTRAVENOUS

## 2017-12-07 MED ORDER — ACETAMINOPHEN 10 MG/ML IV SOLN
INTRAVENOUS | Status: AC
  Filled 2017-12-07: qty 100

## 2017-12-07 MED ORDER — PROPOFOL 10 MG/ML IV BOLUS
INTRAVENOUS | Status: DC | PRN
  Administered 2017-12-07: 150 mg via INTRAVENOUS

## 2017-12-07 MED ORDER — EPHEDRINE SULFATE 50 MG/ML IJ SOLN
INTRAMUSCULAR | Status: DC | PRN
  Administered 2017-12-07: 15 mg via INTRAVENOUS

## 2017-12-07 MED ORDER — OXYCODONE HCL 5 MG PO TABS
ORAL_TABLET | ORAL | Status: AC
  Filled 2017-12-07: qty 1

## 2017-12-07 MED ORDER — MIDAZOLAM HCL 2 MG/2ML IJ SOLN
INTRAMUSCULAR | Status: AC
  Filled 2017-12-07: qty 2

## 2017-12-07 MED ORDER — GABAPENTIN 800 MG PO TABS: 800.0000 mg | ORAL_TABLET | Freq: Every day | ORAL | 0 refills | Status: AC

## 2017-12-07 SURGICAL SUPPLY — 64 items
BAG URINE DRAINAGE (UROLOGICAL SUPPLIES) ×8 IMPLANT
BLADE SURG SZ11 CARB STEEL (BLADE) ×4 IMPLANT
CATH FOLEY 2WAY  5CC 16FR (CATHETERS) ×2
CATH ROBINSON RED A/P 16FR (CATHETERS) ×4 IMPLANT
CATH URTH 16FR FL 2W BLN LF (CATHETERS) ×2 IMPLANT
CHLORAPREP W/TINT 26ML (MISCELLANEOUS) ×4 IMPLANT
CLOSURE WOUND 1/4X4 (GAUZE/BANDAGES/DRESSINGS) ×1
CORD MONOPOLAR M/FML 12FT (MISCELLANEOUS) ×4 IMPLANT
COUNTER NEEDLE 20/40 LG (NEEDLE) IMPLANT
COVER LIGHT HANDLE STERIS (MISCELLANEOUS) ×8 IMPLANT
DERMABOND ADVANCED (GAUZE/BANDAGES/DRESSINGS) ×2
DERMABOND ADVANCED .7 DNX12 (GAUZE/BANDAGES/DRESSINGS) ×2 IMPLANT
DEVICE SUTURE ENDOST 10MM (ENDOMECHANICALS) ×4 IMPLANT
DRAPE STERI POUCH LG 24X46 STR (DRAPES) ×4 IMPLANT
DRSG TEGADERM 2-3/8X2-3/4 SM (GAUZE/BANDAGES/DRESSINGS) ×12 IMPLANT
GAUZE SPONGE NON-WVN 2X2 STRL (MISCELLANEOUS) ×4 IMPLANT
GLOVE BIO SURGEON STRL SZ7 (GLOVE) ×20 IMPLANT
GLOVE INDICATOR 7.5 STRL GRN (GLOVE) ×20 IMPLANT
GOWN STRL REUS W/ TWL LRG LVL3 (GOWN DISPOSABLE) ×4 IMPLANT
GOWN STRL REUS W/ TWL XL LVL3 (GOWN DISPOSABLE) ×2 IMPLANT
GOWN STRL REUS W/TWL LRG LVL3 (GOWN DISPOSABLE) ×4
GOWN STRL REUS W/TWL XL LVL3 (GOWN DISPOSABLE) ×2
GRASPER SUT TROCAR 14GX15 (MISCELLANEOUS) ×4 IMPLANT
IRRIGATION STRYKERFLOW (MISCELLANEOUS) ×2 IMPLANT
IRRIGATOR STRYKERFLOW (MISCELLANEOUS) ×4
IV LACTATED RINGERS 1000ML (IV SOLUTION) ×4 IMPLANT
IV NS 1000ML (IV SOLUTION) ×2
IV NS 1000ML BAXH (IV SOLUTION) ×2 IMPLANT
KIT PINK PAD W/HEAD ARE REST (MISCELLANEOUS) ×4
KIT PINK PAD W/HEAD ARM REST (MISCELLANEOUS) ×2 IMPLANT
KIT TURNOVER CYSTO (KITS) ×4 IMPLANT
LABEL OR SOLS (LABEL) ×4 IMPLANT
LIGASURE VESSEL 5MM BLUNT TIP (ELECTROSURGICAL) IMPLANT
MANIPULATOR VCARE LG CRV RETR (MISCELLANEOUS) ×4 IMPLANT
MANIPULATOR VCARE SML CRV RETR (MISCELLANEOUS) IMPLANT
MANIPULATOR VCARE STD CRV RETR (MISCELLANEOUS) IMPLANT
NS IRRIG 500ML POUR BTL (IV SOLUTION) ×4 IMPLANT
OCCLUDER COLPOPNEUMO (BALLOONS) ×4 IMPLANT
PACK GYN LAPAROSCOPIC (MISCELLANEOUS) ×4 IMPLANT
PAD OB MATERNITY 4.3X12.25 (PERSONAL CARE ITEMS) ×4 IMPLANT
PAD PREP 24X41 OB/GYN DISP (PERSONAL CARE ITEMS) ×4 IMPLANT
POUCH SPECIMEN RETRIEVAL 10MM (ENDOMECHANICALS) IMPLANT
SCISSORS METZENBAUM CVD 33 (INSTRUMENTS) ×4 IMPLANT
SET CYSTO W/LG BORE CLAMP LF (SET/KITS/TRAYS/PACK) ×4 IMPLANT
SLEEVE ENDOPATH XCEL 5M (ENDOMECHANICALS) ×4 IMPLANT
SPONGE VERSALON 2X2 STRL (MISCELLANEOUS) ×4
SPONGE XRAY 4X4 16PLY STRL (MISCELLANEOUS) ×4 IMPLANT
STRIP CLOSURE SKIN 1/4X4 (GAUZE/BANDAGES/DRESSINGS) ×3 IMPLANT
SURGILUBE 2OZ TUBE FLIPTOP (MISCELLANEOUS) ×4 IMPLANT
SUT ENDO VLOC 180-0-8IN (SUTURE) ×4 IMPLANT
SUT MNCRL 4-0 (SUTURE) ×2
SUT MNCRL 4-0 27XMFL (SUTURE) ×2
SUT MNCRL AB 4-0 PS2 18 (SUTURE) ×8 IMPLANT
SUT VIC AB 0 CT1 36 (SUTURE) ×4 IMPLANT
SUT VIC AB 2-0 UR6 27 (SUTURE) ×4 IMPLANT
SUT VIC AB 4-0 SH 27 (SUTURE)
SUT VIC AB 4-0 SH 27XANBCTRL (SUTURE) IMPLANT
SUTURE MNCRL 4-0 27XMF (SUTURE) ×2 IMPLANT
SYR 10ML LL (SYRINGE) ×4 IMPLANT
SYR 50ML LL SCALE MARK (SYRINGE) ×4 IMPLANT
TROCAR ENDO BLADELESS 11MM (ENDOMECHANICALS) ×4 IMPLANT
TROCAR XCEL NON-BLD 5MMX100MML (ENDOMECHANICALS) ×4 IMPLANT
TUBING INSUF HEATED (TUBING) ×4 IMPLANT
TUBING INSUFFLATION (TUBING) ×4 IMPLANT

## 2017-12-07 NOTE — Anesthesia Post-op Follow-up Note (Signed)
Anesthesia QCDR form completed.        

## 2017-12-07 NOTE — Anesthesia Postprocedure Evaluation (Signed)
Anesthesia Post Note  Patient: Sara Tucker  Procedure(s) Performed: HYSTERECTOMY TOTAL LAPAROSCOPIC (N/A ) LAPAROSCOPIC BILATERAL SALPINGECTOMY (Bilateral ) CYSTOSCOPY (N/A )  Patient location during evaluation: PACU Anesthesia Type: General Level of consciousness: awake and alert Pain management: pain level controlled Vital Signs Assessment: post-procedure vital signs reviewed and stable Respiratory status: spontaneous breathing, nonlabored ventilation, respiratory function stable and patient connected to nasal cannula oxygen Cardiovascular status: blood pressure returned to baseline and stable Postop Assessment: no apparent nausea or vomiting Anesthetic complications: no     Last Vitals:  Vitals:   12/07/17 1053 12/07/17 1158  BP: (!) 99/48 (!) 100/51  Pulse: 80 93  Resp: 16 16  Temp: (!) 36.2 C   SpO2: 97% 99%    Last Pain:  Vitals:   12/07/17 1158  TempSrc:   PainSc: 4                  Martha Clan

## 2017-12-07 NOTE — Anesthesia Preprocedure Evaluation (Signed)
Anesthesia Evaluation  Patient identified by MRN, date of birth, ID band Patient awake    Reviewed: Allergy & Precautions, H&P , NPO status , Patient's Chart, lab work & pertinent test results, reviewed documented beta blocker date and time   History of Anesthesia Complications Negative for: history of anesthetic complications  Airway Mallampati: II  TM Distance: >3 FB Neck ROM: full    Dental  (+) Dental Advidsory Given   Pulmonary neg pulmonary ROS, former smoker,           Cardiovascular Exercise Tolerance: Good negative cardio ROS       Neuro/Psych  Headaches, neg Seizures negative psych ROS   GI/Hepatic Neg liver ROS, GERD  ,  Endo/Other  negative endocrine ROS  Renal/GU negative Renal ROS  negative genitourinary   Musculoskeletal   Abdominal   Peds  Hematology negative hematology ROS (+)   Anesthesia Other Findings Past Medical History: No date: Anemia     Comment:  with pregnancy No date: GERD (gastroesophageal reflux disease)     Comment:  occ No date: Headache     Comment:  migraines No date: History of kidney stones     Comment:  h/o No date: Irregular heart beat     Comment:  h/o saw cardiologist and wore a holter monitor for 30               days but revealed nothing-pt states still has rare               palpitations-asymptomic   Reproductive/Obstetrics negative OB ROS                             Anesthesia Physical Anesthesia Plan  ASA: II  Anesthesia Plan: General   Post-op Pain Management:    Induction: Intravenous  PONV Risk Score and Plan: 3 and Ondansetron and Dexamethasone  Airway Management Planned: Oral ETT  Additional Equipment:   Intra-op Plan:   Post-operative Plan: Extubation in OR  Informed Consent: I have reviewed the patients History and Physical, chart, labs and discussed the procedure including the risks, benefits and alternatives  for the proposed anesthesia with the patient or authorized representative who has indicated his/her understanding and acceptance.   Dental Advisory Given  Plan Discussed with: Anesthesiologist, CRNA and Surgeon  Anesthesia Plan Comments:         Anesthesia Quick Evaluation

## 2017-12-07 NOTE — Interval H&P Note (Signed)
History and Physical Interval Note:  12/07/2017 7:15 AM  Sara Tucker  has presented today for surgery, with the diagnosis of AUB  The various methods of treatment have been discussed with the patient and family. After consideration of risks, benefits and other options for treatment, the patient has consented to  Procedure(s): HYSTERECTOMY TOTAL LAPAROSCOPIC (N/A) LAPAROSCOPIC BILATERAL SALPINGECTOMY (Bilateral) CYSTOSCOPY (N/A) as a surgical intervention .  The patient's history has been reviewed, patient examined, no change in status, stable for surgery.  I have reviewed the patient's chart and labs.  Questions were answered to the patient's satisfaction.     Benjaman Kindler

## 2017-12-07 NOTE — Discharge Instructions (Signed)
AMBULATORY SURGERY  DISCHARGE INSTRUCTIONS   1) The drugs that you were given will stay in your system until tomorrow so for the next 24 hours you should not:  A) Drive an automobile B) Make any legal decisions C) Drink any alcoholic beverage   2) You may resume regular meals tomorrow.  Today it is better to start with liquids and gradually work up to solid foods.  You may eat anything you prefer, but it is better to start with liquids, then soup and crackers, and gradually work up to solid foods.   3) Please notify your doctor immediately if you have any unusual bleeding, trouble breathing, redness and pain at the surgery site, drainage, fever, or pain not relieved by medication.    4) Additional Instructions:        Please contact your physician with any problems or Same Day Surgery at [phone], Monday through Friday 6 am to 4 pm, or  at [facility] number at [phone].   Discharge instructions after   total laparoscopic hysterectomy   For the next three days, take ibuprofen and acetaminophen on a schedule, every 8 hours. You can take them together or you can intersperse them, and take one every four hours. I also gave you gabapentin for nighttime, to help you sleep and also to control pain. Take gabapentin medicines at night for at least the next 3 nights. You also have a narcotic, oxycodone, to take as needed if the above medicines don't help enough.  Postop constipation is a major cause of pain. Stay well hydrated, walk as you tolerate, and take over the counter senna as well as stool softeners if you need them.    Signs and Symptoms to Report Call our office at [phone] if you have any of the following.   Fever over 100.4 degrees or higher  Severe stomach pain not relieved with pain medications  Bright red bleeding thats heavier than a period that does not slow with rest  To go the bathroom a lot (frequency), you cant hold your  urine (urgency), or it hurts when you empty your bladder (urinate)  Chest pain  Shortness of breath  Pain in the calves of your legs  Severe nausea and vomiting not relieved with anti-nausea medications  Signs of infection around your wounds, such as redness, hot to touch, swelling, green/yellow drainage (like pus), bad smelling discharge  Any concerns  What You Can Expect after Surgery  You may see some pink tinged, bloody fluid and bruising around the wound. This is normal.  You may notice shoulder and neck pain. This is caused by the gas used during surgery to expand your abdomen so your surgeon could get to the uterus easier.  You may have a sore throat because of the tube in your mouth during general anesthesia. This will go away in 2 to 3 days.  You may have some stomach cramps.  You may notice spotting on your panties.  You may have pain around the incision sites.   Activities after Your Discharge Follow these guidelines to help speed your recovery at home:  Do the coughing and deep breathing as you did in the hospital for 2 weeks. Use the small blue breathing device, called the incentive spirometer for 2 weeks.  Dont drive if you are in pain or taking narcotic pain medicine. You may drive when you can safely slam on the brakes, turn the wheel forcefully, and rotate your torso comfortably. This is typically  1-2 weeks. Practice in a parking lot or side street prior to attempting to drive regularly.   Ask others to help with household chores for 4 weeks.  Do not lift anything heavier that 10 pounds for 4-6 weeks. This includes pets, children, and groceries.  Dont do strenuous activities, exercises, or sports like vacuuming, tennis, squash, etc. until your doctor says it is safe to do so. ---Maintain pelvic rest for 8 weeks. This means nothing in the vagina or rectum at all (no douching, tampons, intercourse) for 8 weeks.   Walk as you feel able. Rest often since it  may take two or three weeks for your energy level to return to normal.   You may climb stairs  Avoid constipation:   -Eat fruits, vegetables, and whole grains. Eat small meals as your appetite will take time to return to normal.   -Drink 6 to 8 glasses of water each day unless your doctor has told you to limit your fluids.   -Use a laxative or stool softener as needed if constipation becomes a problem. You may take Miralax, metamucil, Citrucil, Colace, Senekot, FiberCon, etc. If this does not relieve the constipation, try two tablespoons of Milk Of Magnesia every 8 hours until your bowels move.   You may shower. Gently wash the wounds with a mild soap and water. Pat dry.  Do not get in a hot tub, swimming pool, etc. for 6 weeks.  Do not use lotions, oils, powders on the wounds.  Do not douche, use tampons, or have sex until your doctor says it is okay.  Take your pain medicine when you need it. The medicine may not work as well if the pain is bad.  Take the medicines you were taking before surgery. Other medications you will need are pain medications and possibly constipation and nausea medications (Zofran).  Here is a helpful article from the website [employer], regarding constipation  Here are reasons why constipation occurs after surgery: 1) During the operation and in the recovery room, most people are given opioid pain medication, primarily through an IV, to treat moderate or severe pain. Intravenous opioids include morphine, Dilaudid and fentanyl. After surgery, patients are often prescribed opioid pain medication to take by mouth at home, including codeine, Vicodin, Norco, and Percocet. All of these medications cause constipation by slowing down the movement of your intestine. 2) Changes in your diet before surgery can be another culprit. It is common to get specific instructions to change how you normally eat or drink before your surgery, like only having liquids the day before or  not having anything to eat or drink after midnight the night before surgery. For this reason, temporary dehydration may occur. This, along with not eating or only having liquids, means that you are getting less fiber than usual. Both these factors contribute to constipation. 3) Changes in your diet after surgery can also contribute to the problem. Although many people dont have dietary restrictions after operations, being under anesthesia can make you lose your appetite for several hours and maybe even days. Some people can even have nausea or vomiting. Not eating or drinking normally means that you are not getting enough fiber and you can get dehydrated, both leading to constipation. 4) Lying in a bed more than usual--which happens before, during and after surgery--combined with the medications and diet changes, all work together to slow down your colon and make your poop turn to rock.  No one likes to be constipated.  Lets  face it, its not a pleasant feeling when you dont poop for days, then strain on the toilet to finally pass something large enough to cause damage. An ounce of prevention is worth a pound of cure, so: 1. Assume you will be constipated. 2. Plan and prepare accordingly. Post-surgery is one of those unique situations where the temporary use of laxatives can make a world of difference. Always consult with your doctor, and recognize that if you wait several days after surgery to take a laxative, the constipation might be too severe for these over-the-counter options. It is always important to discuss all medications you plan on taking with your doctor. Ask your doctor if you can start the laxative immediately after surgery. *  Here are go-to post-surgery laxatives: Senna: Senna is an herb that acts as a stimulant laxative, meaning it increases the activity of the intestine to cause you to have a bowel movement. It comes in many forms, but senna pills are easy to take and are sold over  the counter at almost all pharmacies. Since opioid pain medications slow down the activity of the intestine, it makes sense to take a medication to help reverse that side effect. Long-term use of a stimulant laxative is not a good idea since it can make your colon lazy and not function properly; however, temporary use immediately after surgery is acceptable. In general, if you are able to eat a normal diet, taking senna soon after surgery works the best. Senna usually works within hours to produce a bowel movement, but this is less predictable when you are taking different medications after surgery. Try not to wait several days to start taking senna, as often it is too late by then. Just like with all medications or supplements, check with your doctor before starting new treatment.   Magnesium: Magnesium is an important mineral that our body needs. We get magnesium from some foods that we eat, especially foods that are high in fiber such as broccoli, almonds and whole grains. There are also magnesium-based medications used to treat constipation including milk of magnesia (magnesium hydroxide), magnesium citrate and magnesium oxide. They work by drawing water into the intestine, putting it into the class of osmotic laxatives. Magnesium products in low doses appear to be safe, but if taken in very large doses, can lead to problems such as irregular heartbeat, low blood pressure and even death. It can also affect other medications you might be taking, therefore it is important to discuss using magnesium with your physician and pharmacist before initiating therapy. Most over-the-counter magnesium laxatives work very well to help with the constipation related to surgery, but sometimes they work too well and lead to diarrhea. Make sure you are somewhere with easy access to a bathroom, just in case.   Bisacodyl: Bisacodyl (generic name) is sold under brand names such as Dulcolax. Much like senna, it is a  stimulant laxative, meaning it makes your intestines move more quickly to push out the stool. This is another good choice to start taking as soon as your doctor says you can take a laxative after surgery. It comes in pill form and as a suppository, which is a good choice for people who cannot or are not allowed to swallow pills. Studies have shown that it works as a laxative, but like most of these medications, you should use this on a short-term basis only.   Enema: Enemas strike fear in many people, but FEAR NOT! Its nowhere near as big a  deal as you may think. An enema is just a way to get some liquid into your rectum by placing a specially designed device through your anus. If you have never done one, it might seem like a painful, unpleasant, uncomfortable, complicated and lengthy procedure. But in reality, its simple, takes just a few seconds and is highly effective. The small ready-made bottles you buy at the pharmacy are much easier than the hose/large rubber container type. Those recommended positions illustrated in some instructions are generally not necessary to place the enema. Its very similar to the insertion of a tampon, requiring a slight squat. Some extra lubrication on the enemas tip (or on your anus) will make it a breeze. In certain cases, there is no substitute for a good enema. For example, if someone has not pooped for a few days, the beginning of the poop waiting to come out can become rock hard. Passing that hard stool can lead to much pain and problems like anal fissures. Inserting a little liquid to break up the rock-hard stool will help make its passage much easier. Enemas come with different liquids. Most come with saline, but there are also mineral oil options. You can also use warm water in the reusable enema containers. They all work. But since saline can sometimes be irritating, so try a mineral oil or water enema instead.  Here are commonly recommended constipation  medications that do not work well for post-surgery constipation: Docusate: Docusate (generic name) most commonly referred to as Colace (brand name) is not really a laxative, but is classified as a stool softener. Although this medication is commonly prescribed, it is not recommended for several reasons: 1) there is no good medical evidence that it works 2) even if it has an effect, which is very questionable, it is minimal and cannot combat the intestinal slowing caused by the opioid medications. Skip docusate to save money and space in your pillbox for something more effective.  PEG: Miralax (brand name) is basically a chemical called polyethylene glycol (PEG) and it has gained tremendous popularity as a laxative. This product is an osmotic laxative meaning it works by pulling water into the stool, making it softer. This is very similar to the action of natural fiber in foods and supplements. Therefore, the effect seen by this medication is not immediate, causing a bowel movement in a day or more. Is this medication strong enough to battle the constipation related to having an operation? Maybe for some people not prone to constipation. But for most people, other laxatives are better to prevent constipation after surgery.

## 2017-12-07 NOTE — Transfer of Care (Signed)
Immediate Anesthesia Transfer of Care Note  Patient: Sara Tucker  Procedure(s) Performed: HYSTERECTOMY TOTAL LAPAROSCOPIC (N/A ) LAPAROSCOPIC BILATERAL SALPINGECTOMY (Bilateral ) CYSTOSCOPY (N/A )  Patient Location: PACU  Anesthesia Type:General  Level of Consciousness: awake, alert , oriented and patient cooperative  Airway & Oxygen Therapy: Patient Spontanous Breathing and Patient connected to face mask oxygen  Post-op Assessment: Report given to RN and Post -op Vital signs reviewed and stable  Post vital signs: Reviewed and stable  Last Vitals:  Vitals:   12/07/17 0616 12/07/17 0951  BP: 119/75 (!) 108/55  Pulse: 87 (!) 105  Resp: 18 18  Temp: (!) 36.3 C (!) 36.2 C  SpO2: 100% 100%    Last Pain:  Vitals:   12/07/17 0616  TempSrc: Tympanic         Complications: No apparent anesthesia complications

## 2017-12-07 NOTE — OR Nursing (Signed)
Mild nausea after getting dressed.  peppermint oil on cotton ball for ride home.  No vomiting.

## 2017-12-07 NOTE — Op Note (Signed)
Sara Tucker PROCEDURE DATE: 12/07/2017  PREOPERATIVE DIAGNOSIS: AUB, abnormal pap smear POSTOPERATIVE DIAGNOSIS: The same PROCEDURE: Total laparoscopic hysterectomy, bilateral salpingectomy, cystoscopy SURGEON:  Dr. Benjaman Kindler ASSISTANT: Dr. Marcello Moores Schermerhorn Anesthesiologist:  Anesthesiologist: Martha Clan, MD CRNA: Eben Burow, CRNA; Hedda Slade, CRNA  INDICATIONS: 42 y.o. F here for definitive surgical management secondary to the indications listed under preoperative diagnoses; please see preoperative note for further details.  Risks of surgery were discussed with the patient including but not limited to: bleeding which may require transfusion or reoperation; infection which may require antibiotics; injury to bowel, bladder, ureters or other surrounding organs; need for additional procedures; thromboembolic phenomenon, incisional problems and other postoperative/anesthesia complications. Written informed consent was obtained.    FINDINGS:  Small uterus, normal ovaries and upper abdomen. Signs of endometriosis with a right pelvic [employer] window noted.   ANESTHESIA:    General INTRAVENOUS FLUIDS:900  ml ESTIMATED BLOOD LOSS:20 ml URINE OUTPUT: 200 ml   SPECIMENS: Uterus, cervix, bilateral fallopian tubes  COMPLICATIONS: None immediate  PROCEDURE IN DETAIL:  The patient received prophalactic intravenous antibiotics and had sequential compression devices applied to her lower extremities while in the preoperative area.  She was then taken to the operating room where general anesthesia was administered and was found to be adequate.  She was placed in the dorsal lithotomy position, and was prepped and draped in a sterile manner.  A formal time out was performed with all team members present and in agreement.  A V-care uterine manipulator was placed at this time.  A Foley catheter was inserted into her bladder and attached to constant drainage. Attention was turned to the  abdomen and 0.5% Marcaine infused subq. A 31mm umbilical incision was made with the scalpel.  The Optiview 5-mm trocar and sleeve were then advanced without difficulty with the laparoscope under direct visualization into the abdomen.  The abdomen was then insufflated with carbon dioxide gas and adequate pneumoperitoneum was obtained.  A survey of the patient's pelvis and abdomen revealed the findings above.  Bilateral lower quadrant ports (5 mm on the right and 10 mm on the left) were then placed under direct visualization.  The pelvis was then carefully examined.  Attention was turned to the fallopian tubes; these were freed from the underlying mesosalpinx and the uterine attachments using the Ligasure device.  The bilateral round and broad ligaments were then clamped and transected with the Ligasure device.  The uterine artery was then skeletonized and a bladder flap was created.  The ureters were noted to be safely away from the area of dissection.  The bladder was then bluntly dissected off the lower uterine segment.    At this point, attention was turned to the uterine vessels, which were clamped and cauterized using the Ligasure on the left, and then the right. After the uterine blood flow at the level of the internal os was controlled, both arteries were cut with the Ligasure.  Good hemostasis was noted overall.  The uterosacral and cardinal ligaments were clamped, cut and ligated bilaterally .  Attention was then turned to the cervicovaginal junction, and monopolar scissors were used to transect the cervix from the surrounding vagina using the ring of the V-care as a guide. This was done circumferentially allowing total hysterectomy.  The uterus was then removed from the vagina and the vaginal cuff incision was then closed with running 0 V-lock from above.  Overall excellent hemostasis was noted.    Attention was returned to the  abdomen.The ureters were reexamined bilaterally and were pulsating normally.  The abdominal pressure was reduced and hemostasis was confirmed.   Intravenous floruoceine was administered, and cystoscopy showed bilateral ureteral jets.  No stitches were visualized in the bladder during cystoscopy.  All trocars were removed under direct visualization, and the abdomen was desufflated.  All skin incisions were closed with 4-0 Vicryl subcuticular stitches and Dermabond. The patient tolerated the procedures well.  All instruments, needles, and sponge counts were correct x 2. The patient was taken to the recovery room awake, extubated and in stable condition.

## 2017-12-07 NOTE — Anesthesia Procedure Notes (Signed)
Procedure Name: Intubation Date/Time: 12/07/2017 7:36 AM Performed by: Eben Burow, CRNA Pre-anesthesia Checklist: Patient identified, Emergency Drugs available, Suction available, Patient being monitored and Timeout performed Patient Re-evaluated:Patient Re-evaluated prior to induction Oxygen Delivery Method: Circle system utilized Preoxygenation: Pre-oxygenation with 100% oxygen Induction Type: IV induction Ventilation: Mask ventilation without difficulty Laryngoscope Size: Mac and 3 Grade View: Grade I Tube type: Oral Tube size: 7.0 mm Number of attempts: 1 Airway Equipment and Method: Stylet and LTA kit utilized Secured at: 21 cm Tube secured with: Tape Dental Injury: Teeth and Oropharynx as per pre-operative assessment

## 2017-12-10 LAB — SURGICAL PATHOLOGY

## 2018-01-04 ENCOUNTER — Other Ambulatory Visit: Payer: Self-pay | Admitting: Family Medicine

## 2018-01-04 DIAGNOSIS — R1011 Right upper quadrant pain: Secondary | ICD-10-CM

## 2018-01-07 ENCOUNTER — Ambulatory Visit
Admission: RE | Admit: 2018-01-07 | Discharge: 2018-01-07 | Disposition: A | Discharge status: Home / Self Care | Payer: 59 | Source: Ambulatory Visit | Attending: Family Medicine | Admitting: Family Medicine

## 2018-01-07 DIAGNOSIS — R1011 Right upper quadrant pain: Secondary | ICD-10-CM | POA: Insufficient documentation

## 2018-05-16 IMAGING — US US EXTREM LOW VENOUS BILAT
1 series · 13 of 24 positions shown · non-contrast
Comparison: None.

CLINICAL DATA: Lower extremity swelling



[Series 1: us extrem low venous bilat · 0.07mm/px · 13 of 60 slices shown]
[im 1/60]
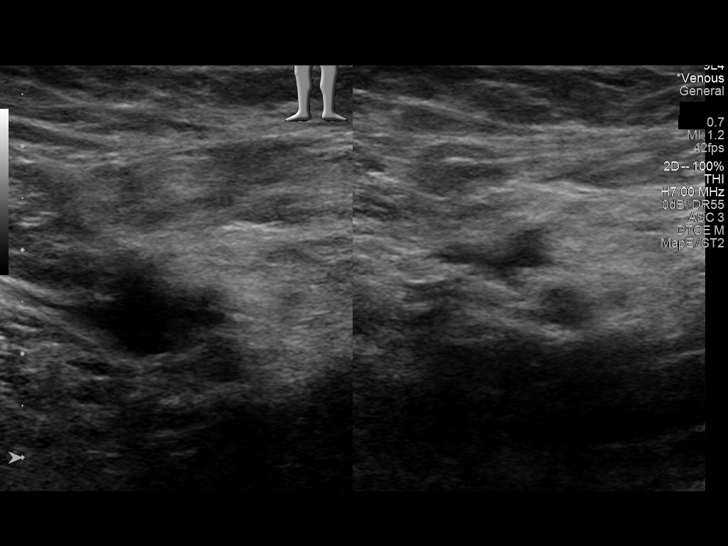
[im 6/60]
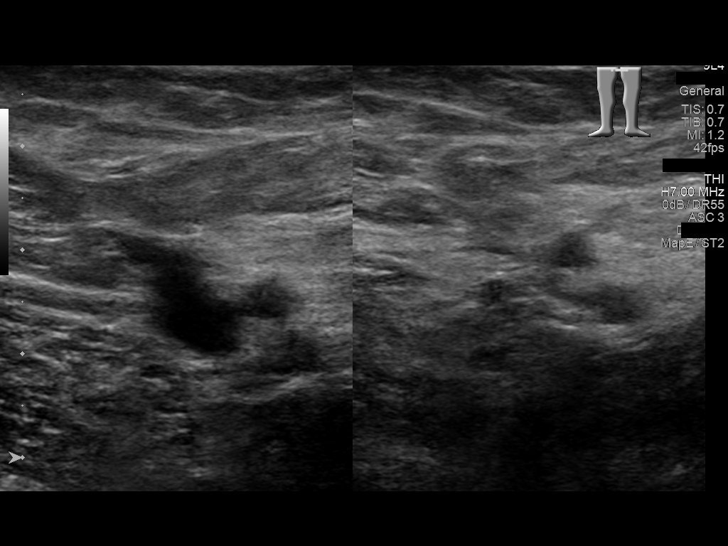
[im 11/60]
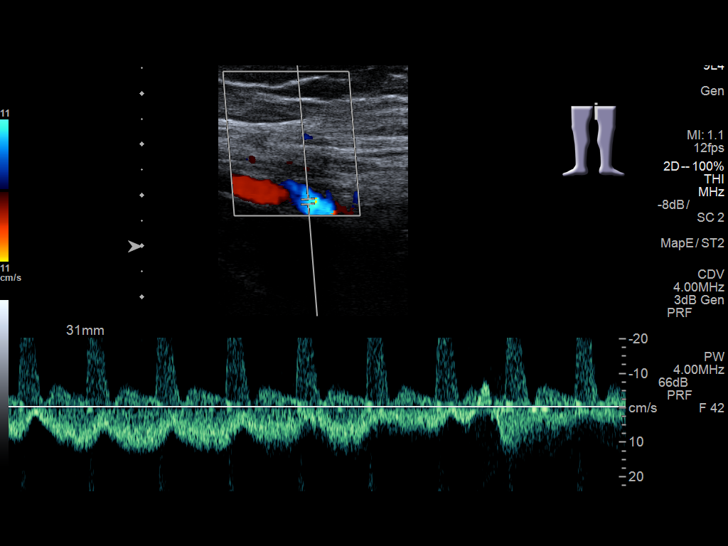
[im 16/60]
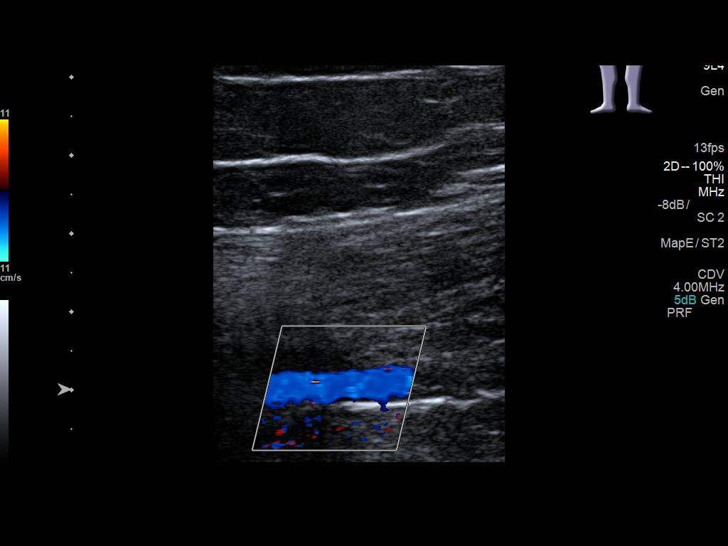
[im 21/60]
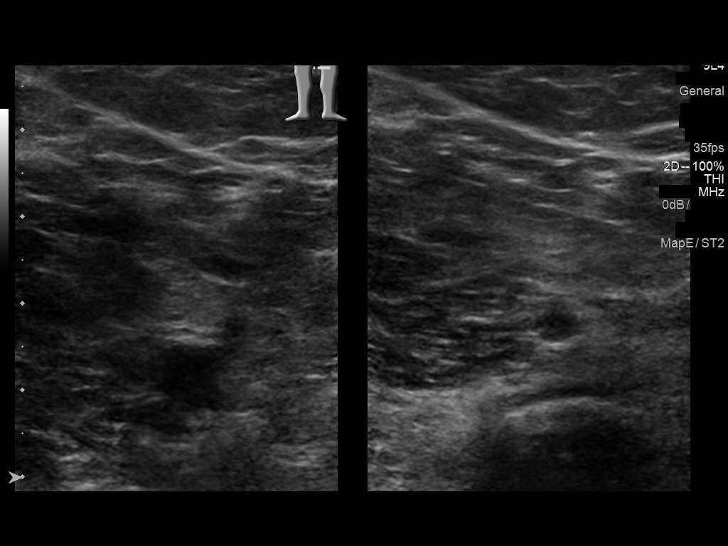
[im 26/60]
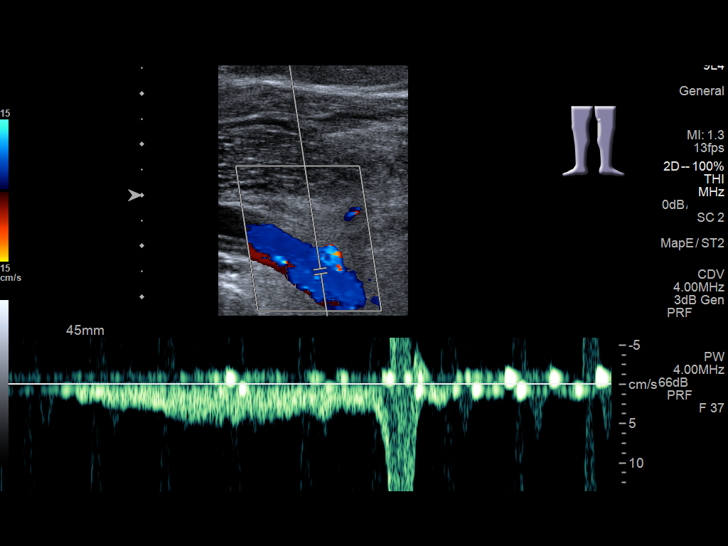
[im 31/60]
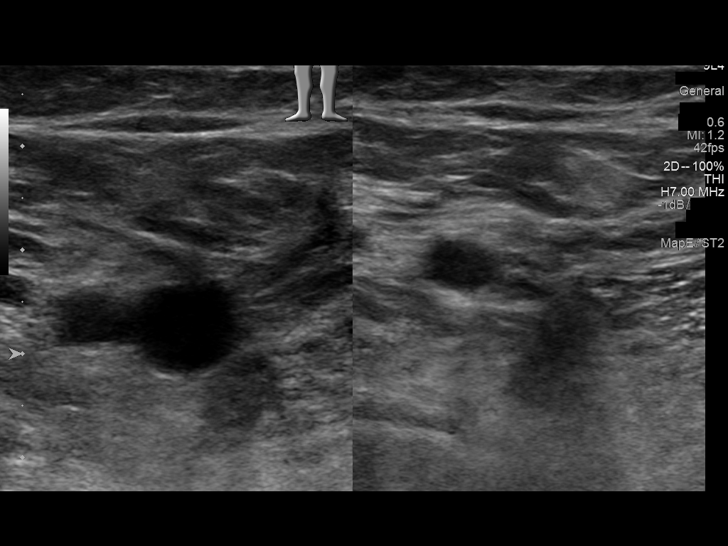
[im 34/60]
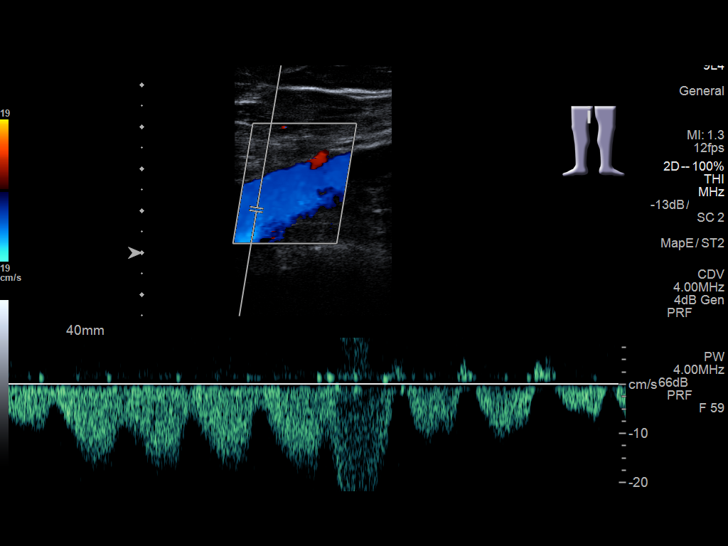
[im 39/60]
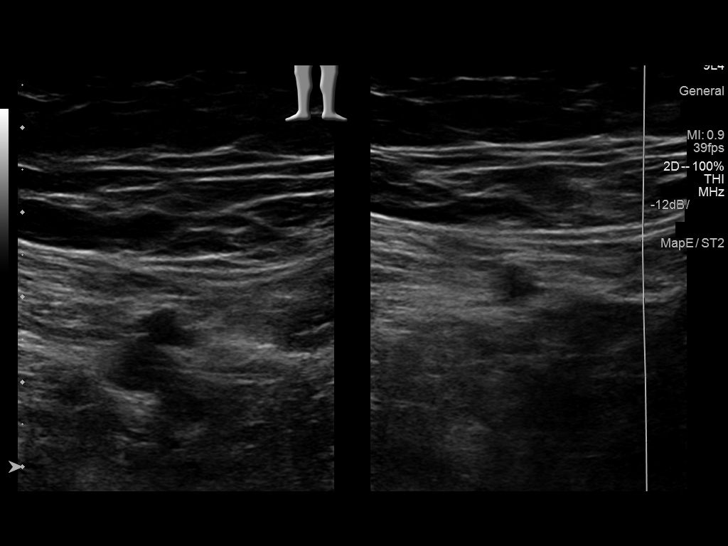
[im 44/60]
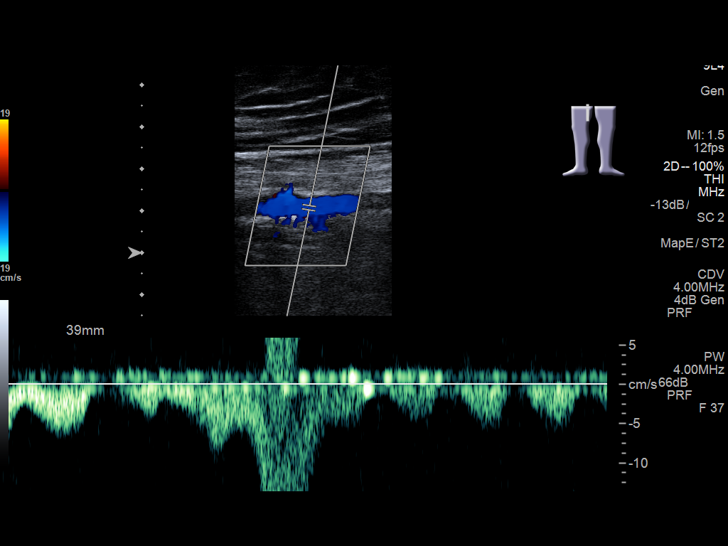
[im 49/60]
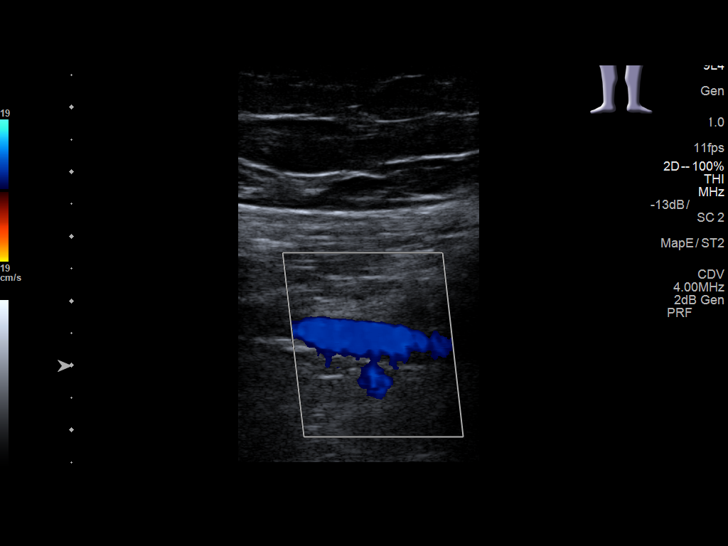
[im 54/60]
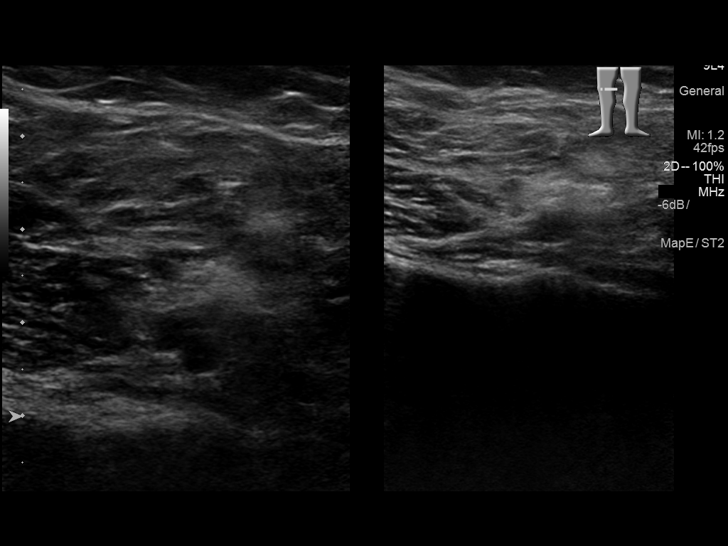
[im 60/60]
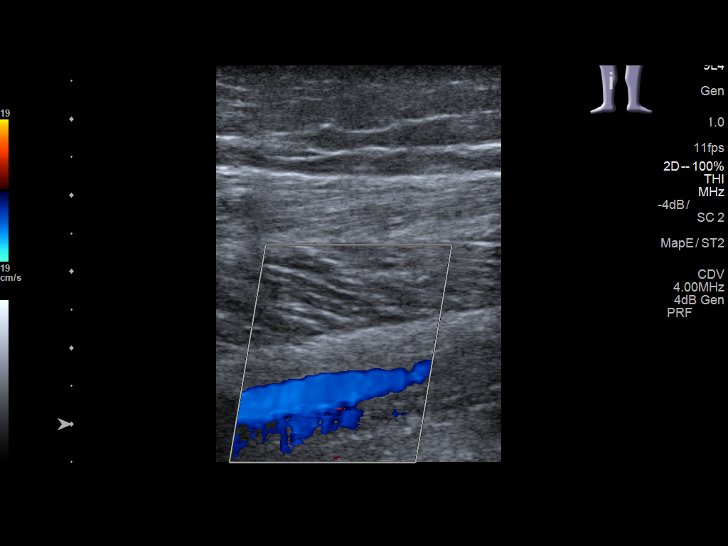

[13 of 24 positions shown; findings below may reference images not displayed]

FINDINGS: RIGHT LOWER EXTREMITY

Common Femoral Vein: No evidence of thrombus. Normal
compressibility, respiratory phasicity and response to augmentation.

Saphenofemoral Junction: No evidence of thrombus. Normal
compressibility and flow on color Doppler imaging.

Profunda Femoral Vein: No evidence of thrombus. Normal
compressibility and flow on color Doppler imaging.

Femoral Vein: No evidence of thrombus. Normal compressibility,
respiratory phasicity and response to augmentation.

Popliteal Vein: No evidence of thrombus. Normal compressibility,
respiratory phasicity and response to augmentation.

Calf Veins: No evidence of thrombus. Normal compressibility and flow
on color Doppler imaging.

Superficial Great Saphenous Vein: No evidence of thrombus. Normal
compressibility and flow on color Doppler imaging.

Venous Reflux:  None.

Other Findings:  None.

LEFT LOWER EXTREMITY

Common Femoral Vein: No evidence of thrombus. Normal
compressibility, respiratory phasicity and response to augmentation.

Saphenofemoral Junction: No evidence of thrombus. Normal
compressibility and flow on color Doppler imaging.

Profunda Femoral Vein: No evidence of thrombus. Normal
compressibility and flow on color Doppler imaging.

Femoral Vein: No evidence of thrombus. Normal compressibility,
respiratory phasicity and response to augmentation.

Popliteal Vein: No evidence of thrombus. Normal compressibility,
respiratory phasicity and response to augmentation.

Calf Veins: No evidence of thrombus. Normal compressibility and flow
on color Doppler imaging.

Superficial Great Saphenous Vein: No evidence of thrombus. Normal
compressibility and flow on color Doppler imaging.

Venous Reflux:  None.

Other Findings:  None.
IMPRESSION: No evidence of deep venous thrombosis.

## 2018-09-23 ENCOUNTER — Emergency Department: Payer: BLUE CROSS/BLUE SHIELD

## 2018-09-23 ENCOUNTER — Encounter: Payer: Self-pay | Admitting: Emergency Medicine

## 2018-09-23 ENCOUNTER — Other Ambulatory Visit: Payer: Self-pay

## 2018-09-23 ENCOUNTER — Emergency Department
Admission: EM | Admit: 2018-09-23 | Discharge: 2018-09-23 | Disposition: A | Discharge status: Home / Self Care | Payer: BLUE CROSS/BLUE SHIELD | Attending: Emergency Medicine | Admitting: Emergency Medicine

## 2018-09-23 DIAGNOSIS — M542 Cervicalgia: Secondary | ICD-10-CM | POA: Insufficient documentation

## 2018-09-23 DIAGNOSIS — Y9389 Activity, other specified: Secondary | ICD-10-CM | POA: Insufficient documentation

## 2018-09-23 DIAGNOSIS — M549 Dorsalgia, unspecified: Secondary | ICD-10-CM | POA: Insufficient documentation

## 2018-09-23 DIAGNOSIS — M545 Low back pain: Secondary | ICD-10-CM | POA: Insufficient documentation

## 2018-09-23 DIAGNOSIS — Z79899 Other long term (current) drug therapy: Secondary | ICD-10-CM | POA: Insufficient documentation

## 2018-09-23 DIAGNOSIS — Z87891 Personal history of nicotine dependence: Secondary | ICD-10-CM | POA: Diagnosis not present

## 2018-09-23 DIAGNOSIS — Y92411 Interstate highway as the place of occurrence of the external cause: Secondary | ICD-10-CM | POA: Diagnosis not present

## 2018-09-23 MED ORDER — CYCLOBENZAPRINE HCL 5 MG PO TABS: 5.0000 mg | ORAL_TABLET | Freq: Three times a day (TID) | ORAL | 0 refills | Status: AC | PRN

## 2018-09-23 MED ORDER — MELOXICAM 15 MG PO TABS: 15.0000 mg | ORAL_TABLET | Freq: Every day | ORAL | 1 refills | Status: AC

## 2018-09-23 MED ORDER — CYCLOBENZAPRINE HCL 10 MG PO TABS
10.0000 mg | ORAL_TABLET | Freq: Once | ORAL | Status: AC
  Administered 2018-09-23: 10 mg via ORAL
  Filled 2018-09-23: qty 1

## 2018-09-23 MED ORDER — KETOROLAC TROMETHAMINE 30 MG/ML IJ SOLN
30.0000 mg | Freq: Once | INTRAMUSCULAR | Status: AC
  Administered 2018-09-23: 30 mg via INTRAMUSCULAR
  Filled 2018-09-23: qty 1
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
## 2018-09-23 NOTE — ED Notes (Signed)
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
## 2019-02-13 ENCOUNTER — Telehealth: Payer: BLUE CROSS/BLUE SHIELD | Admitting: Family

## 2019-02-13 DIAGNOSIS — J302 Other seasonal allergic rhinitis: Secondary | ICD-10-CM | POA: Diagnosis not present

## 2019-02-13 MED ORDER — FLUTICASONE PROPIONATE 50 MCG/ACT NA SUSP: 2.0000 | Freq: Every day | NASAL | 0 refills | Status: AC

## 2019-02-13 MED ORDER — LEVOCETIRIZINE DIHYDROCHLORIDE 5 MG PO TABS: 5.0000 mg | ORAL_TABLET | Freq: Every evening | ORAL | 0 refills | Status: AC

## 2019-02-13 NOTE — Progress Notes (Signed)
E visit for Allergic Rhinitis We are sorry that you are not feeling well.  Here is how we plan to help!  Based on what you have shared with me it looks like you have Allergic Rhinitis.  Rhinitis is when a reaction occurs that causes nasal congestion, runny nose, sneezing, and itching.  Most types of rhinitis are caused by an inflammation and are associated with symptoms in the eyes ears or throat. There are several types of rhinitis.  The most common are acute rhinitis, which is usually caused by a viral illness, allergic or seasonal rhinitis, and nonallergic or year-round rhinitis.  Nasal allergies occur certain times of the year.  Allergic rhinitis is caused when allergens in the air trigger the release of histamine in the body.  Histamine causes itching, swelling, and fluid to build up in the fragile linings of the nasal passages, sinuses and eyelids.  An itchy nose and clear discharge are common.  I recommend the following over the counter treatments: Xyzal 5 mg take 1 tablet daily  I also would recommend a nasal spray: Flonase 2 sprays into each nostril once daily  I sent this in as a prescription for you  HOME CARE:   You can use an over-the-counter saline nasal spray as needed  Avoid areas where there is heavy dust, mites, or molds  Stay indoors on windy days during the pollen season  Keep windows closed in home, at least in bedroom; use air conditioner.  Use high-efficiency house air filter  Keep windows closed in car, turn AC on re-circulate  Avoid playing out with dog during pollen season  GET HELP RIGHT AWAY IF:   If your symptoms do not improve within 10 days  You become short of breath  You develop yellow or green discharge from your nose for over 3 days  You have coughing fits  MAKE SURE YOU:   Understand these instructions  Will watch your condition  Will get help right away if you are not doing well or get worse  Thank you for choosing an  e-visit. Your e-visit answers were reviewed by a board certified advanced clinical practitioner to complete your personal care plan. Depending upon the condition, your plan could have included both over the counter or prescription medications. Please review your pharmacy choice. Be sure that the pharmacy you have chosen is open so that you can pick up your prescription now.  If there is a problem you may message your provider in [place] to have the prescription routed to another pharmacy. Your safety is important to [place]. If you have drug allergies check your prescription carefully.  For the next 24 hours, you can use MyChart to ask questions about today's visit, request a non-urgent call back, or ask for a work or school excuse from your e-visit provider. You will get an email in the next two days asking about your experience. I hope that your e-visit has been valuable and will speed your recovery.

## 2019-05-15 ENCOUNTER — Other Ambulatory Visit: Payer: Self-pay | Admitting: Internal Medicine

## 2019-05-15 DIAGNOSIS — Z1231 Encounter for screening mammogram for malignant neoplasm of breast: Secondary | ICD-10-CM

## 2019-05-22 ENCOUNTER — Other Ambulatory Visit: Payer: Self-pay

## 2019-05-22 ENCOUNTER — Ambulatory Visit
Admission: RE | Admit: 2019-05-22 | Discharge: 2019-05-22 | Disposition: A | Discharge status: Home / Self Care | Payer: BC Managed Care – PPO | Source: Ambulatory Visit | Attending: Internal Medicine | Admitting: Internal Medicine

## 2019-05-22 DIAGNOSIS — Z1231 Encounter for screening mammogram for malignant neoplasm of breast: Secondary | ICD-10-CM | POA: Diagnosis not present

## 2019-05-22 HISTORY — DX: Malignant (primary) neoplasm, unspecified: C80.1

## 2019-05-23 ENCOUNTER — Other Ambulatory Visit: Payer: Self-pay | Admitting: Internal Medicine

## 2019-05-23 DIAGNOSIS — R928 Other abnormal and inconclusive findings on diagnostic imaging of breast: Secondary | ICD-10-CM

## 2019-05-23 DIAGNOSIS — N6489 Other specified disorders of breast: Secondary | ICD-10-CM

## 2019-05-27 ENCOUNTER — Ambulatory Visit
Admission: RE | Admit: 2019-05-27 | Discharge: 2019-05-27 | Disposition: A | Discharge status: Home / Self Care | Payer: BC Managed Care – PPO | Source: Ambulatory Visit | Attending: Internal Medicine | Admitting: Internal Medicine

## 2019-05-27 ENCOUNTER — Other Ambulatory Visit: Payer: Self-pay

## 2019-05-27 DIAGNOSIS — R928 Other abnormal and inconclusive findings on diagnostic imaging of breast: Secondary | ICD-10-CM | POA: Diagnosis not present

## 2019-05-27 DIAGNOSIS — N6489 Other specified disorders of breast: Secondary | ICD-10-CM | POA: Diagnosis present

## 2019-05-28 ENCOUNTER — Other Ambulatory Visit: Payer: Self-pay | Admitting: Internal Medicine

## 2019-05-29 ENCOUNTER — Other Ambulatory Visit: Payer: Self-pay | Admitting: Internal Medicine

## 2019-05-29 DIAGNOSIS — R928 Other abnormal and inconclusive findings on diagnostic imaging of breast: Secondary | ICD-10-CM

## 2019-06-04 ENCOUNTER — Ambulatory Visit
Admission: RE | Admit: 2019-06-04 | Discharge: 2019-06-04 | Disposition: A | Discharge status: Home / Self Care | Payer: BC Managed Care – PPO | Source: Ambulatory Visit | Attending: Internal Medicine | Admitting: Internal Medicine

## 2019-06-04 ENCOUNTER — Other Ambulatory Visit: Payer: Self-pay

## 2019-06-04 DIAGNOSIS — R928 Other abnormal and inconclusive findings on diagnostic imaging of breast: Secondary | ICD-10-CM | POA: Diagnosis not present

## 2019-06-04 HISTORY — PX: BREAST BIOPSY: SHX20

## 2019-06-06 LAB — SURGICAL PATHOLOGY

## 2020-11-08 IMAGING — MG STEREOTACTIC VACUUM ASSIST RIGHT
8 of 10 series · 8 of 18 positions shown · non-contrast
Comparison: Previous exams.
COMPARISON: Previous exams.

Addendum:
CLINICAL DATA: Right breast upper outer quadrant distortion.

EXAM:
RIGHT BREAST STEREOTACTIC CORE NEEDLE BIOPSY

[R (1 of 7)]
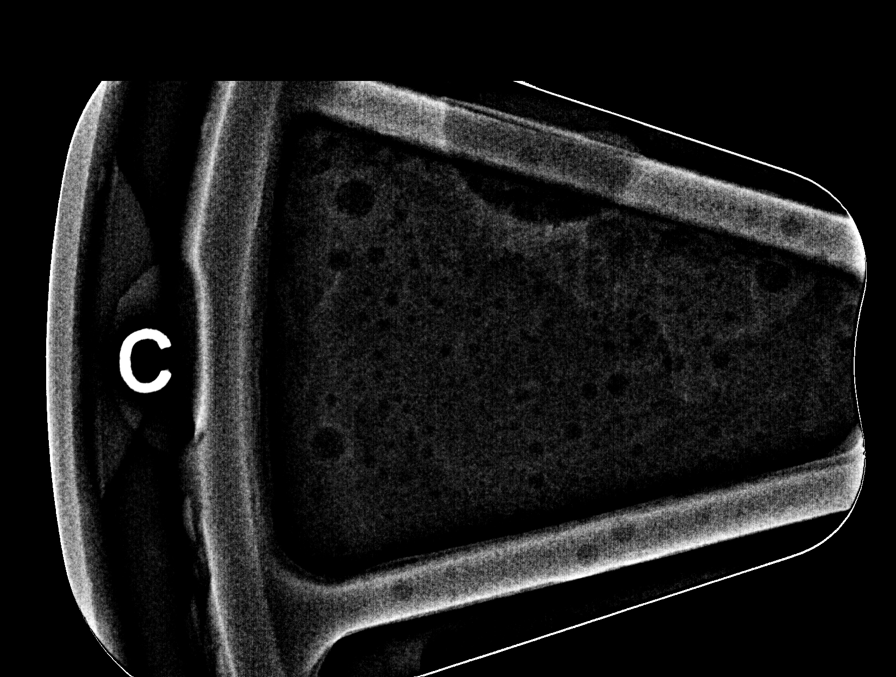

[R (2 of 7)]
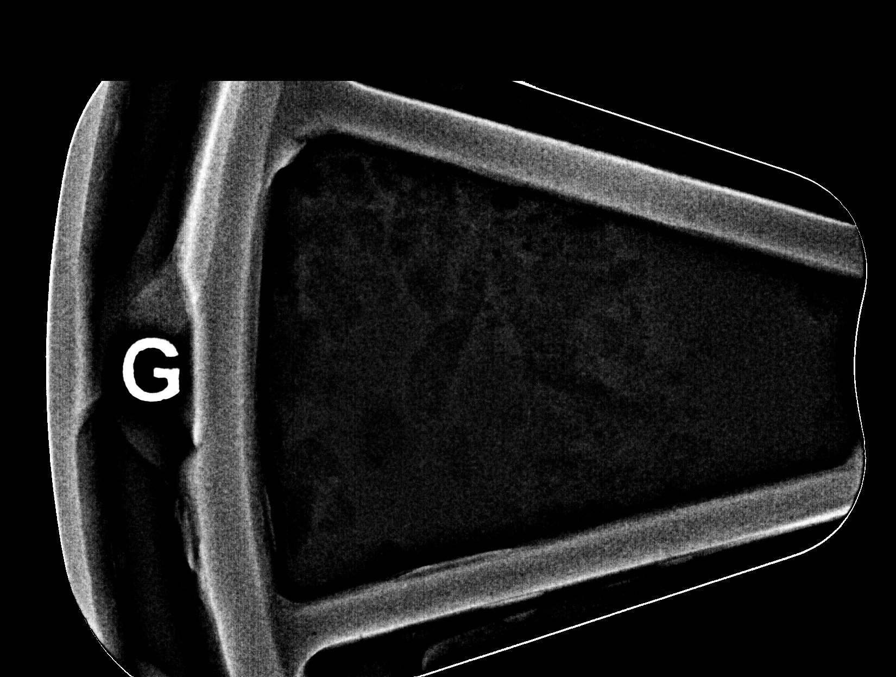

[R (3 of 7)]
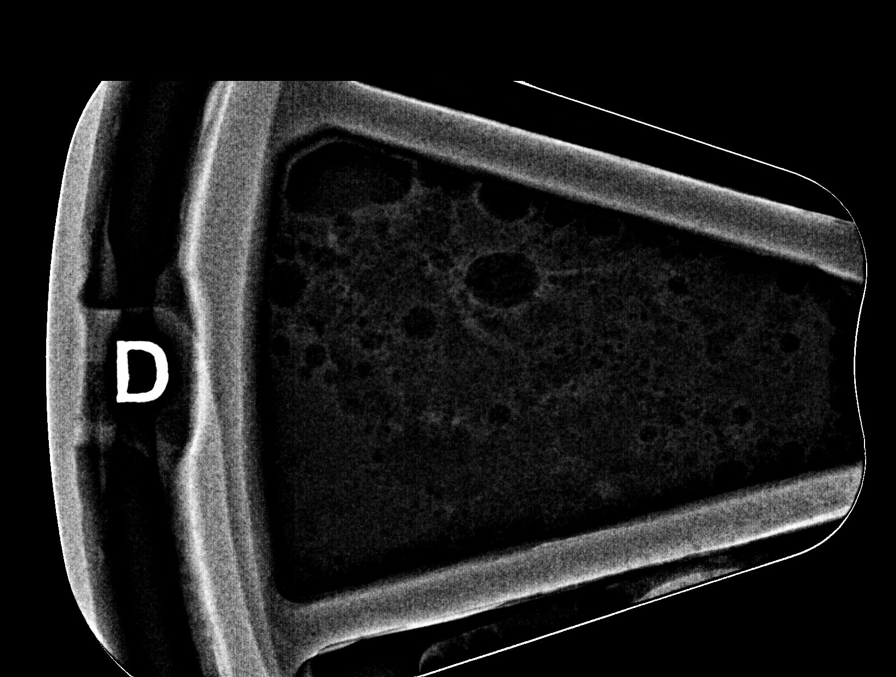

[R (4 of 7)]
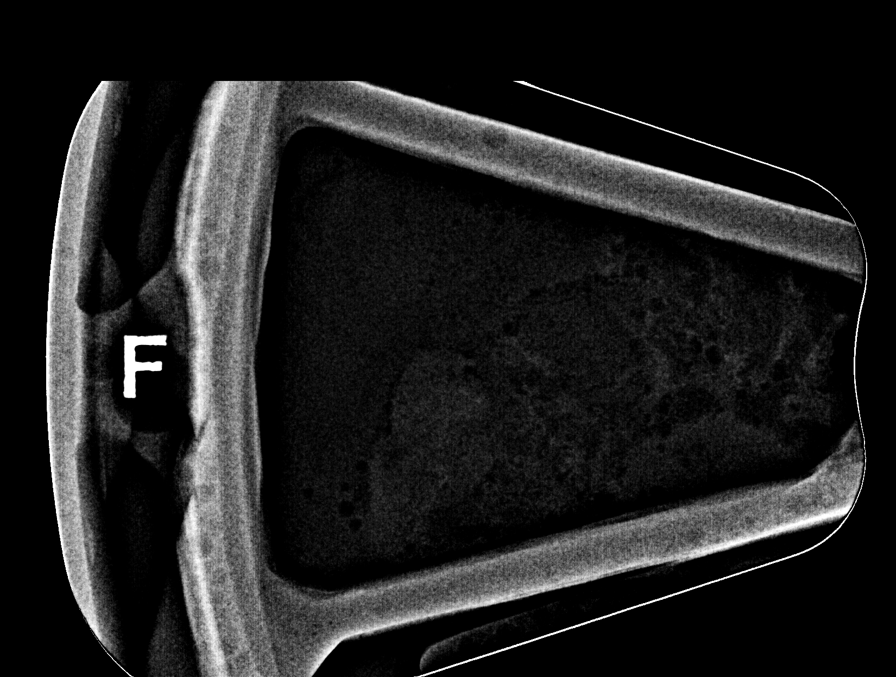

[R (5 of 7)]
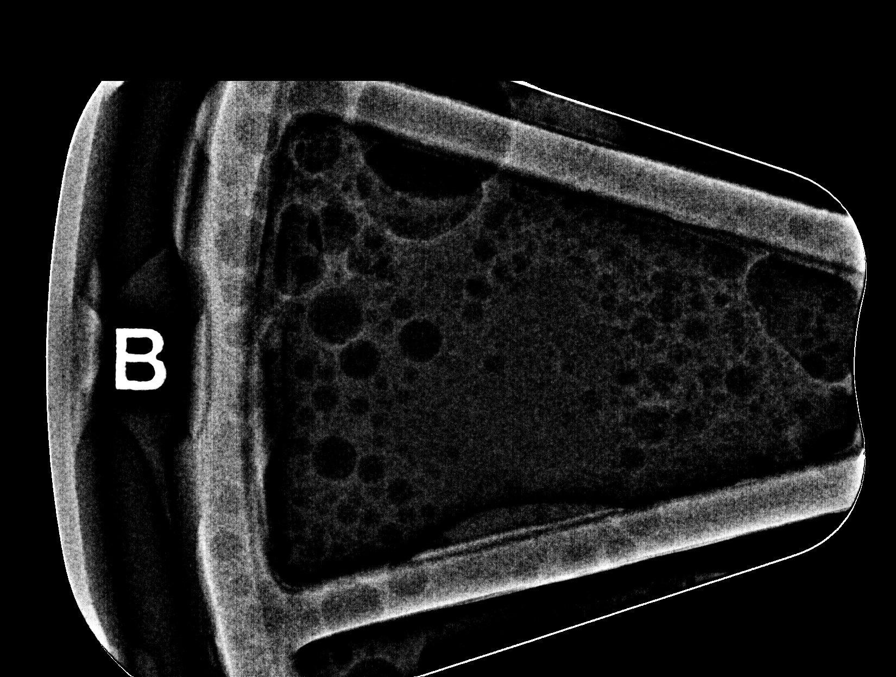

[R (6 of 7)]
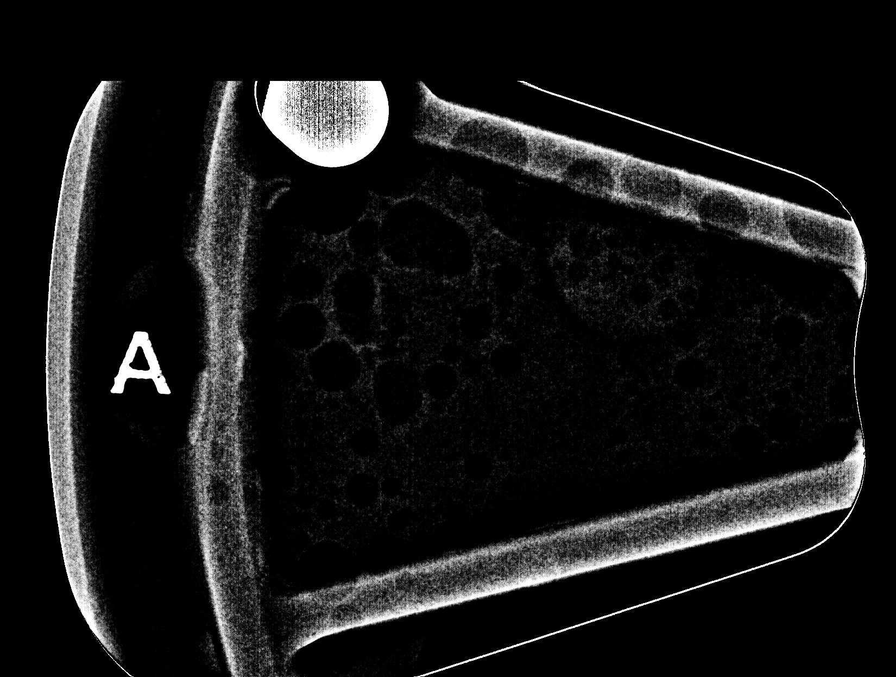

[R (7 of 7)]
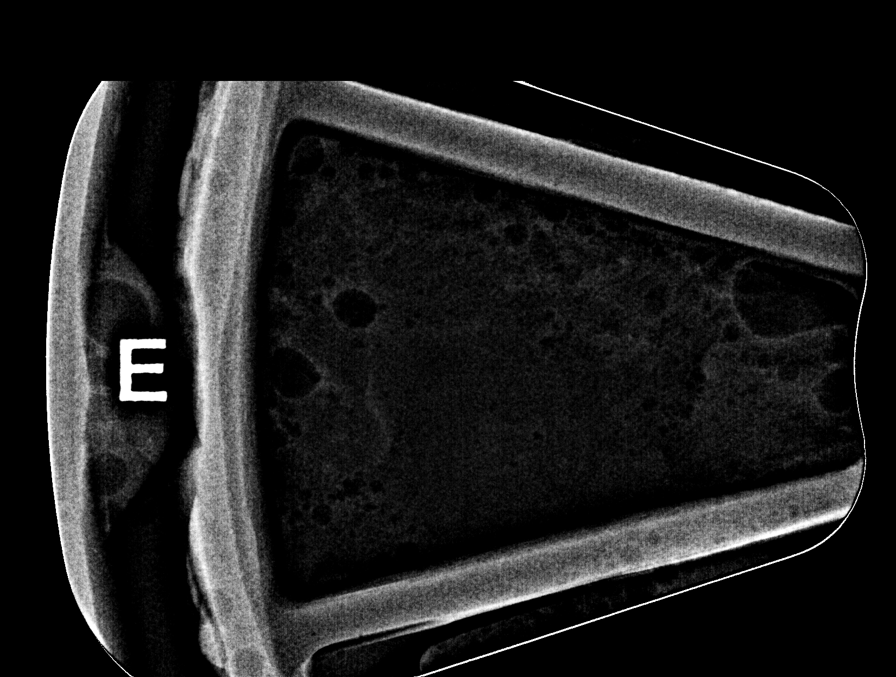

[R MLO]
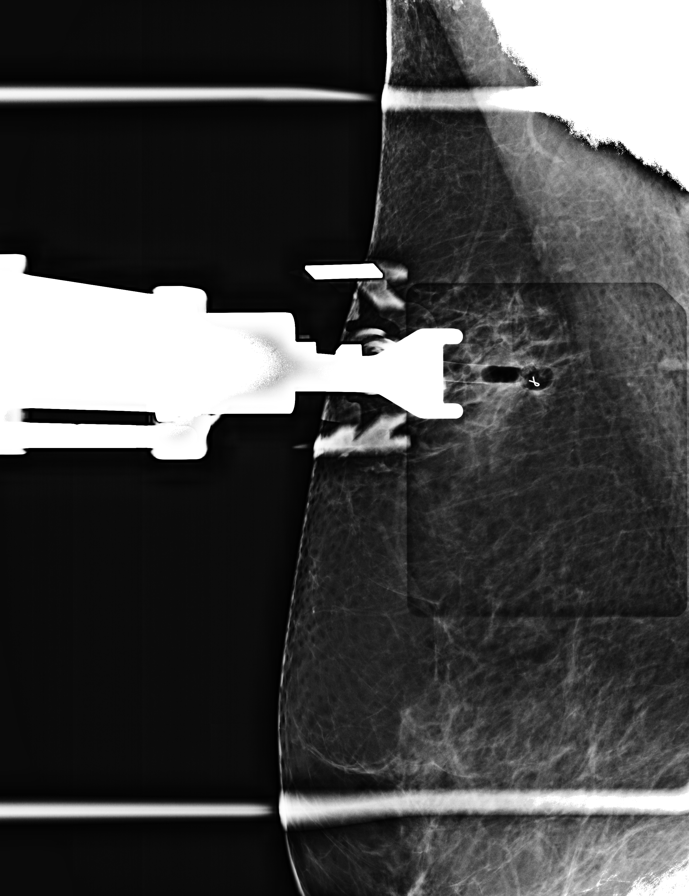

[8 of 18 positions shown; findings below may reference images not displayed]



Using sterile technique and 1% Lidocaine as local anesthetic, under
stereotactic guidance, a 9 gauge vacuum assisted device was used to
perform core needle biopsy of distortion in the right breast upper
outer quadrant using a MLO approach. Specimen radiograph was
performed showing presence of tissue.

Lesion quadrant: Upper outer quadrant

At the conclusion of the procedure, a ribbon shaped tissue marker
clip was deployed into the biopsy cavity. Follow-up 2-view mammogram
was performed and dictated separately.
IMPRESSION: Stereotactic-guided biopsy of right breast. No apparent
complications.

ADDENDUM:
PATHOLOGY revealed: BREAST, RIGHT- PREDOMINANTLY ADIPOSE TISSUE WITH
STROMAL CHANGES CONSISTENT WITH

PSEUDOANGIOMATOUS STROMAL HYPERPLASIA. PASH - DEEPER SECTIONS
EXAMINED. - NEGATIVE FOR ATYPIA AND MALIGNANCY.

CONCORDANCE: Pathology results are concordant with imaging findings,
per Dr. Dieter Consuegra.

Pathology results were discussed with patient via telephone. The
patient reported doing well after the biopsy with tenderness at the
site. Post biopsy care instructions were reviewed and questions were
answered. The patient was encouraged to call [HOSPITAL]
for any additional concerns.

Recommendation: Routine bilateral screening mammogram in one year.

Addendum by Caleb Aujla RN on 06/06/2019.



Using sterile technique and 1% Lidocaine as local anesthetic, under
stereotactic guidance, a 9 gauge vacuum assisted device was used to
perform core needle biopsy of distortion in the right breast upper
outer quadrant using a MLO approach. Specimen radiograph was
performed showing presence of tissue.

Lesion quadrant: Upper outer quadrant

At the conclusion of the procedure, a ribbon shaped tissue marker
clip was deployed into the biopsy cavity. Follow-up 2-view mammogram
was performed and dictated separately.
IMPRESSION: Stereotactic-guided biopsy of right breast. No apparent
complications.

## 2020-12-20 ENCOUNTER — Other Ambulatory Visit: Payer: Self-pay | Admitting: Internal Medicine

## 2020-12-20 DIAGNOSIS — Z1231 Encounter for screening mammogram for malignant neoplasm of breast: Secondary | ICD-10-CM

## 2022-05-12 ENCOUNTER — Other Ambulatory Visit: Payer: Self-pay | Admitting: Family Medicine

## 2022-05-12 DIAGNOSIS — N644 Mastodynia: Secondary | ICD-10-CM

## 2022-05-30 ENCOUNTER — Ambulatory Visit
Admission: RE | Admit: 2022-05-30 | Discharge: 2022-05-30 | Disposition: A | Discharge status: Home / Self Care | Payer: BC Managed Care – PPO | Source: Ambulatory Visit | Attending: Family Medicine | Admitting: Family Medicine

## 2022-05-30 DIAGNOSIS — N644 Mastodynia: Secondary | ICD-10-CM | POA: Diagnosis present

## 2022-11-14 ENCOUNTER — Other Ambulatory Visit: Payer: Self-pay

## 2022-11-14 DIAGNOSIS — R270 Ataxia, unspecified: Secondary | ICD-10-CM

## 2022-11-14 DIAGNOSIS — R413 Other amnesia: Secondary | ICD-10-CM

## 2022-11-20 ENCOUNTER — Ambulatory Visit
Admission: RE | Admit: 2022-11-20 | Discharge: 2022-11-20 | Disposition: A | Discharge status: Home / Self Care | Payer: No Typology Code available for payment source | Source: Ambulatory Visit | Attending: Internal Medicine | Admitting: Internal Medicine

## 2022-11-20 DIAGNOSIS — R413 Other amnesia: Secondary | ICD-10-CM | POA: Diagnosis present

## 2022-11-20 DIAGNOSIS — R270 Ataxia, unspecified: Secondary | ICD-10-CM

## 2022-11-30 ENCOUNTER — Other Ambulatory Visit: Payer: Self-pay | Admitting: Neurology

## 2022-11-30 DIAGNOSIS — G932 Benign intracranial hypertension: Secondary | ICD-10-CM

## 2022-11-30 DIAGNOSIS — R4189 Other symptoms and signs involving cognitive functions and awareness: Secondary | ICD-10-CM

## 2022-12-06 ENCOUNTER — Other Ambulatory Visit: Payer: Self-pay | Admitting: Neurology

## 2022-12-06 DIAGNOSIS — G932 Benign intracranial hypertension: Secondary | ICD-10-CM

## 2022-12-13 NOTE — Progress Notes (Signed)
Patient for DG Lumbar Puncture on Thurs 12/14/2022, I called and spoke with the patient on the  phone and gave pre-procedure instructions. Pt was made aware to be here at 9:30a at the new entrance. Pt stated understanding.  Called 12/13/2022

## 2022-12-14 ENCOUNTER — Ambulatory Visit
Admission: RE | Admit: 2022-12-14 | Discharge: 2022-12-14 | Disposition: A | Discharge status: Home / Self Care | Payer: No Typology Code available for payment source | Source: Ambulatory Visit | Attending: Neurology | Admitting: Neurology

## 2022-12-14 DIAGNOSIS — R519 Headache, unspecified: Secondary | ICD-10-CM | POA: Diagnosis not present

## 2022-12-14 DIAGNOSIS — G932 Benign intracranial hypertension: Secondary | ICD-10-CM | POA: Diagnosis not present

## 2022-12-14 MED ORDER — LIDOCAINE HCL (PF) 1 % IJ SOLN
10.0000 mL | Freq: Once | INTRAMUSCULAR | Status: AC
  Administered 2022-12-14: 5 mL
  Filled 2022-12-14: qty 10

## 2022-12-14 NOTE — Procedures (Signed)
PROCEDURE SUMMARY:  Successful fluoroscopic guided LP. Opening pressure 26 cm of H2O, pre procedure headache 4/10 Yielded 12 mL of colorless CSF fluid.  Closing pressure 16 cm of H2O, post procedure headache 2/10 No immediate complications.  Pt tolerated well.   Specimen was not sent for labs.  EBL < 13m  MRockney Ghee2/06/2023 11:27 AM

## 2022-12-14 NOTE — Discharge Instructions (Signed)
Lumbar Puncture, Care After Refer to this sheet in the next few weeks. These instructions provide you with information on caring for yourself after your procedure. Your health care provider may also give you more specific instructions. Your treatment has been planned according to current medical practices, but problems sometimes occur. Call your health care provider if you have any problems or questions after your procedure. What can I expect after the procedure? After your procedure, it is typical to have the following sensations: Mild discomfort or pain at the insertion site. Mild headache that is relieved with pain medicines.  Follow these instructions at home:  Avoid lifting anything heavier than 10 lb (4.5 kg) for at least 12 hours after the procedure. Drink enough fluids to keep your urine clear or pale yellow. Lay flat or as flat as possible for the remainder of the day. Contact a health care provider if: You have fever or chills. You have nausea or vomiting. You have a headache that lasts for more than 2 days. Get help right away if: You have any numbness or tingling in your legs. You are unable to control your bowel or bladder. You have bleeding or swelling in your back at the insertion site. You are dizzy or faint. This information is not intended to replace advice given to you by your health care provider. Make sure you discuss any questions you have with your health care provider. Document Released: 10/28/2013 Document Revised: 03/30/2016 Document Reviewed: 07/01/2013 [employer] Interactive Patient Education  2017 [employer] 

## 2022-12-21 ENCOUNTER — Ambulatory Visit
Admission: RE | Admit: 2022-12-21 | Discharge: 2022-12-21 | Disposition: A | Discharge status: Home / Self Care | Payer: No Typology Code available for payment source | Source: Ambulatory Visit | Attending: Neurology | Admitting: Neurology

## 2022-12-21 DIAGNOSIS — G932 Benign intracranial hypertension: Secondary | ICD-10-CM | POA: Diagnosis present

## 2022-12-21 DIAGNOSIS — R4189 Other symptoms and signs involving cognitive functions and awareness: Secondary | ICD-10-CM | POA: Insufficient documentation

## 2023-05-17 ENCOUNTER — Other Ambulatory Visit: Payer: Self-pay | Admitting: Internal Medicine

## 2023-05-17 DIAGNOSIS — Z1231 Encounter for screening mammogram for malignant neoplasm of breast: Secondary | ICD-10-CM

## 2023-06-12 ENCOUNTER — Ambulatory Visit: Payer: No Typology Code available for payment source

## 2023-12-07 ENCOUNTER — Other Ambulatory Visit: Payer: Self-pay | Admitting: Internal Medicine

## 2023-12-07 DIAGNOSIS — Z1231 Encounter for screening mammogram for malignant neoplasm of breast: Secondary | ICD-10-CM

## 2023-12-10 ENCOUNTER — Inpatient Hospital Stay: Admission: RE | Admit: 2023-12-10 | Payer: No Typology Code available for payment source | Source: Ambulatory Visit

## 2023-12-18 ENCOUNTER — Inpatient Hospital Stay: Admission: RE | Admit: 2023-12-18 | Payer: No Typology Code available for payment source | Source: Ambulatory Visit

## 2024-08-12 ENCOUNTER — Ambulatory Visit
